# Patient Record
Sex: Female | Born: 1980 | Race: White | Hispanic: No | Marital: Married | Smoking: Current every day smoker
Health system: Southern US, Community
[De-identification: ages and names within clinical notes are randomized; demographics above are authoritative.]

## PROBLEM LIST (undated history)

## (undated) DIAGNOSIS — A159 Respiratory tuberculosis unspecified: Secondary | ICD-10-CM

## (undated) DIAGNOSIS — K2970 Gastritis, unspecified, without bleeding: Secondary | ICD-10-CM

## (undated) DIAGNOSIS — M1990 Unspecified osteoarthritis, unspecified site: Secondary | ICD-10-CM

## (undated) DIAGNOSIS — K219 Gastro-esophageal reflux disease without esophagitis: Secondary | ICD-10-CM

## (undated) DIAGNOSIS — IMO0002 Reserved for concepts with insufficient information to code with codable children: Secondary | ICD-10-CM

## (undated) DIAGNOSIS — Z9889 Other specified postprocedural states: Secondary | ICD-10-CM

## (undated) DIAGNOSIS — R87619 Unspecified abnormal cytological findings in specimens from cervix uteri: Secondary | ICD-10-CM

## (undated) DIAGNOSIS — R112 Nausea with vomiting, unspecified: Secondary | ICD-10-CM

## (undated) DIAGNOSIS — T8859XA Other complications of anesthesia, initial encounter: Secondary | ICD-10-CM

## (undated) DIAGNOSIS — T4145XA Adverse effect of unspecified anesthetic, initial encounter: Secondary | ICD-10-CM

## (undated) DIAGNOSIS — Z87442 Personal history of urinary calculi: Secondary | ICD-10-CM

## (undated) DIAGNOSIS — M329 Systemic lupus erythematosus, unspecified: Secondary | ICD-10-CM

## (undated) DIAGNOSIS — N3010 Interstitial cystitis (chronic) without hematuria: Secondary | ICD-10-CM

## (undated) DIAGNOSIS — M359 Systemic involvement of connective tissue, unspecified: Secondary | ICD-10-CM

## (undated) HISTORY — PX: ABDOMINAL HYSTERECTOMY: SHX81

## (undated) HISTORY — PX: CHOLECYSTECTOMY: SHX55

## (undated) HISTORY — PX: BLADDER SURGERY: SHX569

## (undated) HISTORY — PX: TONSILLECTOMY: SUR1361

## (undated) HISTORY — PX: TUBAL LIGATION: SHX77

---
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
## 2002-09-26 ENCOUNTER — Encounter: Admission: RE | Admit: 2002-09-26 | Discharge: 2002-09-26 | Payer: Self-pay

## 2007-12-03 ENCOUNTER — Emergency Department: Payer: Self-pay | Admitting: Internal Medicine

## 2008-07-25 ENCOUNTER — Emergency Department: Payer: Self-pay | Admitting: Emergency Medicine

## 2008-09-22 ENCOUNTER — Emergency Department: Payer: Self-pay

## 2009-03-28 ENCOUNTER — Emergency Department (HOSPITAL_COMMUNITY): Admission: EM | Admit: 2009-03-28 | Discharge: 2009-03-28 | Payer: Self-pay | Admitting: Emergency Medicine

## 2010-04-23 ENCOUNTER — Emergency Department (HOSPITAL_COMMUNITY)
Admission: EM | Admit: 2010-04-23 | Discharge: 2010-04-23 | Payer: Self-pay | Source: Home / Self Care | Admitting: Emergency Medicine

## 2010-06-14 ENCOUNTER — Emergency Department (HOSPITAL_COMMUNITY)
Admission: EM | Admit: 2010-06-14 | Discharge: 2010-06-15 | Disposition: A | Discharge status: Home / Self Care | Payer: Medicaid Other | Attending: Emergency Medicine | Admitting: Emergency Medicine

## 2010-06-14 ENCOUNTER — Emergency Department (HOSPITAL_COMMUNITY): Payer: Medicaid Other

## 2010-06-14 DIAGNOSIS — K625 Hemorrhage of anus and rectum: Secondary | ICD-10-CM | POA: Insufficient documentation

## 2010-06-14 DIAGNOSIS — F1210 Cannabis abuse, uncomplicated: Secondary | ICD-10-CM | POA: Insufficient documentation

## 2010-06-14 DIAGNOSIS — F17200 Nicotine dependence, unspecified, uncomplicated: Secondary | ICD-10-CM | POA: Insufficient documentation

## 2010-06-14 LAB — URINALYSIS, ROUTINE W REFLEX MICROSCOPIC
Nitrite: NEGATIVE
Specific Gravity, Urine: 1.01 (ref 1.005–1.030)
Urobilinogen, UA: 0.2 mg/dL (ref 0.0–1.0)
pH: 7 (ref 5.0–8.0)

## 2010-06-14 LAB — OCCULT BLOOD, POC DEVICE: Fecal Occult Bld: NEGATIVE

## 2010-06-14 LAB — DIFFERENTIAL
Basophils Absolute: 0 10*3/uL (ref 0.0–0.1)
Basophils Relative: 0 % (ref 0–1)
Eosinophils Absolute: 0.1 10*3/uL (ref 0.0–0.7)
Lymphocytes Relative: 38 % (ref 12–46)
Monocytes Absolute: 0.5 10*3/uL (ref 0.1–1.0)
Monocytes Relative: 7 % (ref 3–12)
Neutro Abs: 3.7 10*3/uL (ref 1.7–7.7)

## 2010-06-14 LAB — CBC
Platelets: 189 10*3/uL (ref 150–400)
RBC: 4.03 MIL/uL (ref 3.87–5.11)
WBC: 7 10*3/uL (ref 4.0–10.5)

## 2010-06-14 LAB — BASIC METABOLIC PANEL
Calcium: 8.8 mg/dL (ref 8.4–10.5)
GFR calc non Af Amer: >60 mL/min (ref >60)
Potassium: 3.5 mEq/L (ref 3.5–5.1)
Sodium: 138 mEq/L (ref 135–145)

## 2010-08-16 ENCOUNTER — Emergency Department (HOSPITAL_COMMUNITY)
Admission: EM | Admit: 2010-08-16 | Discharge: 2010-08-16 | Disposition: A | Discharge status: Home / Self Care | Payer: Medicaid Other | Attending: Emergency Medicine | Admitting: Emergency Medicine

## 2010-08-16 DIAGNOSIS — R350 Frequency of micturition: Secondary | ICD-10-CM | POA: Insufficient documentation

## 2010-08-16 DIAGNOSIS — N949 Unspecified condition associated with female genital organs and menstrual cycle: Secondary | ICD-10-CM | POA: Insufficient documentation

## 2010-08-16 DIAGNOSIS — IMO0002 Reserved for concepts with insufficient information to code with codable children: Secondary | ICD-10-CM | POA: Insufficient documentation

## 2010-08-16 DIAGNOSIS — N898 Other specified noninflammatory disorders of vagina: Secondary | ICD-10-CM | POA: Insufficient documentation

## 2010-08-16 LAB — URINALYSIS, ROUTINE W REFLEX MICROSCOPIC
Bilirubin Urine: NEGATIVE
Nitrite: NEGATIVE
Protein, ur: NEGATIVE mg/dL
Specific Gravity, Urine: 1.01 (ref 1.005–1.030)
Urobilinogen, UA: 1 mg/dL (ref 0.0–1.0)

## 2010-08-16 LAB — GLUCOSE, CAPILLARY

## 2010-08-16 LAB — WET PREP, GENITAL
Clue Cells Wet Prep HPF POC: NONE SEEN
WBC, Wet Prep HPF POC: NONE SEEN

## 2010-08-16 LAB — POCT PREGNANCY, URINE: Preg Test, Ur: NEGATIVE

## 2010-08-17 LAB — GC/CHLAMYDIA PROBE AMP, GENITAL: Chlamydia, DNA Probe: NEGATIVE

## 2010-09-02 ENCOUNTER — Emergency Department (HOSPITAL_COMMUNITY): Payer: Medicaid Other

## 2010-09-02 ENCOUNTER — Emergency Department (HOSPITAL_COMMUNITY)
Admission: EM | Admit: 2010-09-02 | Discharge: 2010-09-02 | Disposition: A | Discharge status: Home / Self Care | Payer: Medicaid Other | Attending: Emergency Medicine | Admitting: Emergency Medicine

## 2010-09-02 DIAGNOSIS — K921 Melena: Secondary | ICD-10-CM | POA: Insufficient documentation

## 2010-09-02 DIAGNOSIS — R1013 Epigastric pain: Secondary | ICD-10-CM | POA: Insufficient documentation

## 2010-09-02 DIAGNOSIS — M549 Dorsalgia, unspecified: Secondary | ICD-10-CM | POA: Insufficient documentation

## 2010-09-02 LAB — URINALYSIS, ROUTINE W REFLEX MICROSCOPIC
Glucose, UA: NEGATIVE mg/dL
Ketones, ur: NEGATIVE mg/dL
Nitrite: NEGATIVE
Specific Gravity, Urine: 1.012 (ref 1.005–1.030)
pH: 5.5 (ref 5.0–8.0)

## 2010-09-02 LAB — OCCULT BLOOD, POC DEVICE: Fecal Occult Bld: NEGATIVE

## 2010-09-02 LAB — CBC
Hemoglobin: 11.4 g/dL — ABNORMAL LOW (ref 12.0–15.0)
MCH: 25 pg — ABNORMAL LOW (ref 26.0–34.0)
Platelets: 246 10*3/uL (ref 150–400)
RBC: 4.56 MIL/uL (ref 3.87–5.11)
WBC: 7.4 10*3/uL (ref 4.0–10.5)

## 2010-09-02 LAB — BASIC METABOLIC PANEL
BUN: 10 mg/dL (ref 6–23)
Chloride: 102 mEq/L (ref 96–112)
GFR calc Af Amer: >60 mL/min (ref >60)
GFR calc non Af Amer: >60 mL/min (ref >60)
Potassium: 3.7 mEq/L (ref 3.5–5.1)
Sodium: 138 mEq/L (ref 135–145)

## 2010-09-02 LAB — PREGNANCY, URINE: Preg Test, Ur: NEGATIVE

## 2010-09-02 LAB — DIFFERENTIAL
Basophils Absolute: 0 10*3/uL (ref 0.0–0.1)
Basophils Relative: 0 % (ref 0–1)
Eosinophils Absolute: 0.1 10*3/uL (ref 0.0–0.7)
Monocytes Relative: 5 % (ref 3–12)
Neutro Abs: 4.4 10*3/uL (ref 1.7–7.7)
Neutrophils Relative %: 60 % (ref 43–77)

## 2011-01-18 ENCOUNTER — Emergency Department (HOSPITAL_COMMUNITY)
Admission: EM | Admit: 2011-01-18 | Discharge: 2011-01-18 | Disposition: A | Discharge status: Home / Self Care | Payer: Medicaid Other | Attending: Emergency Medicine | Admitting: Emergency Medicine

## 2011-01-18 DIAGNOSIS — H5710 Ocular pain, unspecified eye: Secondary | ICD-10-CM | POA: Insufficient documentation

## 2011-01-18 DIAGNOSIS — H05019 Cellulitis of unspecified orbit: Secondary | ICD-10-CM | POA: Insufficient documentation

## 2011-02-20 ENCOUNTER — Ambulatory Visit
Admission: RE | Admit: 2011-02-20 | Discharge: 2011-02-20 | Disposition: A | Discharge status: Home / Self Care | Payer: Medicaid Other | Source: Ambulatory Visit | Attending: Family Medicine | Admitting: Family Medicine

## 2011-02-20 ENCOUNTER — Other Ambulatory Visit: Payer: Self-pay | Admitting: Family Medicine

## 2011-02-20 DIAGNOSIS — M25519 Pain in unspecified shoulder: Secondary | ICD-10-CM

## 2011-02-20 DIAGNOSIS — M25539 Pain in unspecified wrist: Secondary | ICD-10-CM

## 2012-02-01 ENCOUNTER — Encounter (HOSPITAL_COMMUNITY): Payer: Self-pay | Admitting: Nurse Practitioner

## 2012-02-01 ENCOUNTER — Emergency Department (HOSPITAL_COMMUNITY): Payer: Medicaid Other

## 2012-02-01 ENCOUNTER — Emergency Department (HOSPITAL_COMMUNITY)
Admission: EM | Admit: 2012-02-01 | Discharge: 2012-02-01 | Disposition: A | Discharge status: Home / Self Care | Payer: Medicaid Other | Attending: Emergency Medicine | Admitting: Emergency Medicine

## 2012-02-01 DIAGNOSIS — N3010 Interstitial cystitis (chronic) without hematuria: Secondary | ICD-10-CM | POA: Insufficient documentation

## 2012-02-01 DIAGNOSIS — N949 Unspecified condition associated with female genital organs and menstrual cycle: Secondary | ICD-10-CM | POA: Insufficient documentation

## 2012-02-01 DIAGNOSIS — R300 Dysuria: Secondary | ICD-10-CM | POA: Insufficient documentation

## 2012-02-01 DIAGNOSIS — N739 Female pelvic inflammatory disease, unspecified: Secondary | ICD-10-CM | POA: Insufficient documentation

## 2012-02-01 DIAGNOSIS — F17200 Nicotine dependence, unspecified, uncomplicated: Secondary | ICD-10-CM | POA: Insufficient documentation

## 2012-02-01 HISTORY — DX: Interstitial cystitis (chronic) without hematuria: N30.10

## 2012-02-01 HISTORY — DX: Gastritis, unspecified, without bleeding: K29.70

## 2012-02-01 LAB — CBC WITH DIFFERENTIAL/PLATELET
Basophils Relative: 0 % (ref 0–1)
Eosinophils Absolute: 0.1 10*3/uL (ref 0.0–0.7)
Hemoglobin: 15 g/dL (ref 12.0–15.0)
Lymphs Abs: 2.7 10*3/uL (ref 0.7–4.0)
MCH: 30.6 pg (ref 26.0–34.0)
Neutro Abs: 9 10*3/uL — ABNORMAL HIGH (ref 1.7–7.7)
Neutrophils Relative %: 73 % (ref 43–77)
Platelets: 222 10*3/uL (ref 150–400)
RBC: 4.9 MIL/uL (ref 3.87–5.11)

## 2012-02-01 LAB — POCT PREGNANCY, URINE: Preg Test, Ur: NEGATIVE

## 2012-02-01 LAB — URINALYSIS, MICROSCOPIC ONLY
Bilirubin Urine: NEGATIVE
Glucose, UA: NEGATIVE mg/dL
Hgb urine dipstick: NEGATIVE
Ketones, ur: NEGATIVE mg/dL
pH: 5.5 (ref 5.0–8.0)

## 2012-02-01 LAB — COMPREHENSIVE METABOLIC PANEL
ALT: 13 U/L (ref 0–35)
Albumin: 3.9 g/dL (ref 3.5–5.2)
Alkaline Phosphatase: 78 U/L (ref 39–117)
Chloride: 101 mEq/L (ref 96–112)
Potassium: 3.7 mEq/L (ref 3.5–5.1)
Sodium: 138 mEq/L (ref 135–145)
Total Bilirubin: 0.3 mg/dL (ref 0.3–1.2)
Total Protein: 7.8 g/dL (ref 6.0–8.3)

## 2012-02-01 MED ORDER — OXYCODONE-ACETAMINOPHEN 5-325 MG PO TABS: 1.0000 | ORAL_TABLET | Freq: Four times a day (QID) | ORAL | Status: DC | PRN

## 2012-02-01 MED ORDER — METRONIDAZOLE 500 MG PO TABS: 500.0000 mg | ORAL_TABLET | Freq: Two times a day (BID) | ORAL | Status: DC

## 2012-02-01 MED ORDER — AZITHROMYCIN 500 MG PO TABS: 1000.0000 mg | ORAL_TABLET | Freq: Once | ORAL | Status: DC

## 2012-02-01 MED ORDER — OXYCODONE-ACETAMINOPHEN 5-325 MG PO TABS
1.0000 | ORAL_TABLET | Freq: Once | ORAL | Status: AC
  Administered 2012-02-01: 1 via ORAL
  Filled 2012-02-01: qty 1

## 2012-02-01 NOTE — ED Provider Notes (Signed)
History     CSN: [id number]  Arrival date & time 02/01/12  1529   First MD Initiated Contact with Patient 02/01/12 1837      Chief Complaint  Patient presents with  . Pelvic Pain    (Consider location/radiation/quality/duration/timing/severity/associated sxs/prior treatment) Patient is a 31 y.o. female presenting with pelvic pain. The history is provided by the patient.  Pelvic Pain This is a new problem. Pertinent negatives include no chest pain, no abdominal pain, no headaches and no shortness of breath.   patient saw her primary care doctor on Tuesday for abdominal pain and was diagnosed with PID and Trichomonas. She was recently seen at [facility] for similar symptoms. She was given IM antibiotics since her on oral Avelox. She states she was given 6 pills and threw them up. She's continued to have abdominal pain. She states it goes up her back. She has some dysuria but has a history of interstitial cystitis. She's had a previous tubal ligation. She states that she's been told before that she had an abscess in her pelvis. She states it went way on its own. Denies vaginal discharge. No fevers. No chills.  Past Medical History  Diagnosis Date  . Interstitial cystitis   . Gastritis     History reviewed. No pertinent past surgical history.  History reviewed. No pertinent family history.  History  Substance Use Topics  . Smoking status: Current Every Day Smoker  . Smokeless tobacco: Not on file  . Alcohol Use: No    OB History    Grav Para Term Preterm Abortions TAB SAB Ect Mult Living                  Review of Systems  Constitutional: Negative for activity change and appetite change.  HENT: Negative for neck stiffness.   Eyes: Negative for pain.  Respiratory: Negative for chest tightness and shortness of breath.   Cardiovascular: Negative for chest pain and leg swelling.  Gastrointestinal: Negative for nausea, vomiting, abdominal pain and diarrhea.  Genitourinary:  Positive for flank pain and pelvic pain. Negative for hematuria, vaginal bleeding, vaginal discharge and vaginal pain.  Musculoskeletal: Negative for back pain.  Skin: Negative for rash.  Neurological: Negative for weakness, numbness and headaches.  Psychiatric/Behavioral: Negative for behavioral problems.    Allergies  Nsaids  Home Medications   Current Outpatient Rx  Name Route Sig Dispense Refill  . AZITHROMYCIN 500 MG PO TABS Oral Take 1,000 mg by mouth once. Last taken Tuesday 01/30/12    . PHENTERMINE HCL 37.5 MG PO CAPS Oral Take 37.5 mg by mouth every morning.    Marland Kitchen PROMETHAZINE HCL 25 MG PO TABS Oral Take 25 mg by mouth every 4 (four) hours as needed. For nausea    . AZITHROMYCIN 500 MG PO TABS Oral Take 2 tablets (1,000 mg total) by mouth once. 2 tablet 0  . METRONIDAZOLE 500 MG PO TABS Oral Take 1 tablet (500 mg total) by mouth 2 (two) times daily. 14 tablet 0  . OXYCODONE-ACETAMINOPHEN 5-325 MG PO TABS Oral Take 1-2 tablets by mouth every 6 (six) hours as needed for pain. 8 tablet 0    BP 122/86  Pulse 92  Temp 98.1 F (36.7 C) (Oral)  Resp 18  SpO2 100%  Physical Exam  Nursing note and vitals reviewed. Constitutional: She is oriented to person, place, and time. She appears well-developed and well-nourished.  HENT:  Head: Normocephalic and atraumatic.  Eyes: EOM are normal. Pupils are equal,  round, and reactive to light.  Neck: Normal range of motion. Neck supple.  Cardiovascular: Normal rate, regular rhythm and normal heart sounds.   No murmur heard. Pulmonary/Chest: Effort normal and breath sounds normal. No respiratory distress. She has no wheezes. She has no rales.  Abdominal: Soft. Bowel sounds are normal. She exhibits no distension. There is tenderness. There is no rebound and no guarding.       Suprapubic to lower abdominal tenderness. Worse on left side. No rebound or guarding.  Musculoskeletal: Normal range of motion.  Neurological: She is alert and  oriented to person, place, and time. No cranial nerve deficit.  Skin: Skin is warm and dry.  Psychiatric: She has a normal mood and affect. Her speech is normal.    ED Course  Procedures (including critical care time)  Labs Reviewed  CBC WITH DIFFERENTIAL - Abnormal; Notable for the following:    WBC 12.2 (*)     Neutro Abs 9.0 (*)     All other components within normal limits  COMPREHENSIVE METABOLIC PANEL - Abnormal; Notable for the following:    Glucose, Bld 108 (*)     All other components within normal limits  URINALYSIS, MICROSCOPIC ONLY - Abnormal; Notable for the following:    Squamous Epithelial / LPF FEW (*)     All other components within normal limits  POCT PREGNANCY, URINE   US Transvaginal Non-ob  02/01/2012  *RADIOLOGY REPORT*  Clinical Data:  Pelvic pain.  Rule out tubo-ovarian abscess.  TRANSABDOMINAL AND TRANSVAGINAL ULTRASOUND OF PELVIS DOPPLER ULTRASOUND OF OVARIES  Technique:  Both transabdominal and transvaginal ultrasound examinations of the pelvis were performed. Transabdominal technique was performed for global imaging of the pelvis including uterus, ovaries, adnexal regions, and pelvic cul-de-sac.  It was necessary to proceed with endovaginal exam following the transabdominal exam to visualize the ovary / adnexa.  Color and duplex Doppler ultrasound was utilized to evaluate blood flow to the ovaries.  Comparison:  11/20/2011 from [facility].  Findings:  Uterus:  8.1 x 5.7 x 5.3 cm.  Mildly heterogeneous uterine echotexture again identified.  Endometrium:  Normal for age, 14 mm.  Right ovary: 3.6 x 2.8 x 2.1 cm. Normal in morphology.  Left ovary:   3.1 x 2.6 x 2.3 cm.  A minimally hypoechoic lesion has peripheral hypervascularity and measures 1.7 cm on image 73.  Pulsed Doppler evaluation demonstrates normal low-resistance arterial and venous waveforms in both ovaries.  No significant free fluid.  IMPRESSION:  1.  No evidence of ovarian or adnexal torsion.  No  evidence of tubal ovarian abscess. 2.  1.7 cm left ovarian lesion demonstrates peripheral hypervascularity and is most likely a corpus luteal cyst. 3.  Heterogeneous uterine echotexture again may represent adenomyosis.   Original Report Authenticated By: Consuello Bossier, M.D.    US Pelvis Complete  02/01/2012  *RADIOLOGY REPORT*  Clinical Data:  Pelvic pain.  Rule out tubo-ovarian abscess.  TRANSABDOMINAL AND TRANSVAGINAL ULTRASOUND OF PELVIS DOPPLER ULTRASOUND OF OVARIES  Technique:  Both transabdominal and transvaginal ultrasound examinations of the pelvis were performed. Transabdominal technique was performed for global imaging of the pelvis including uterus, ovaries, adnexal regions, and pelvic cul-de-sac.  It was necessary to proceed with endovaginal exam following the transabdominal exam to visualize the ovary / adnexa.  Color and duplex Doppler ultrasound was utilized to evaluate blood flow to the ovaries.  Comparison:  11/20/2011 from [facility].  Findings:  Uterus:  8.1 x 5.7 x 5.3 cm.  Mildly heterogeneous uterine echotexture again identified.  Endometrium:  Normal for age, 14 mm.  Right ovary: 3.6 x 2.8 x 2.1 cm. Normal in morphology.  Left ovary:   3.1 x 2.6 x 2.3 cm.  A minimally hypoechoic lesion has peripheral hypervascularity and measures 1.7 cm on image 73.  Pulsed Doppler evaluation demonstrates normal low-resistance arterial and venous waveforms in both ovaries.  No significant free fluid.  IMPRESSION:  1.  No evidence of ovarian or adnexal torsion.  No evidence of tubal ovarian abscess. 2.  1.7 cm left ovarian lesion demonstrates peripheral hypervascularity and is most likely a corpus luteal cyst. 3.  Heterogeneous uterine echotexture again may represent adenomyosis.   Original Report Authenticated By: Consuello Bossier, M.D.    [place] Art/ven Flow Abd Pelv Doppler  02/01/2012  *RADIOLOGY REPORT*  Clinical Data:  Pelvic pain.  Rule out tubo-ovarian abscess.  TRANSABDOMINAL AND TRANSVAGINAL  ULTRASOUND OF PELVIS DOPPLER ULTRASOUND OF OVARIES  Technique:  Both transabdominal and transvaginal ultrasound examinations of the pelvis were performed. Transabdominal technique was performed for global imaging of the pelvis including uterus, ovaries, adnexal regions, and pelvic cul-de-sac.  It was necessary to proceed with endovaginal exam following the transabdominal exam to visualize the ovary / adnexa.  Color and duplex Doppler ultrasound was utilized to evaluate blood flow to the ovaries.  Comparison:  11/20/2011 from [facility].  Findings:  Uterus:  8.1 x 5.7 x 5.3 cm.  Mildly heterogeneous uterine echotexture again identified.  Endometrium:  Normal for age, 14 mm.  Right ovary: 3.6 x 2.8 x 2.1 cm. Normal in morphology.  Left ovary:   3.1 x 2.6 x 2.3 cm.  A minimally hypoechoic lesion has peripheral hypervascularity and measures 1.7 cm on image 73.  Pulsed Doppler evaluation demonstrates normal low-resistance arterial and venous waveforms in both ovaries.  No significant free fluid.  IMPRESSION:  1.  No evidence of ovarian or adnexal torsion.  No evidence of tubal ovarian abscess. 2.  1.7 cm left ovarian lesion demonstrates peripheral hypervascularity and is most likely a corpus luteal cyst. 3.  Heterogeneous uterine echotexture again may represent adenomyosis.   Original Report Authenticated By: Consuello Bossier, M.D.      1. PID (pelvic inflammatory disease)       MDM  Patient with a diagnosis of pelvic inflammatory disease by her primary care Dr. Pelvic done 2 days ago. She was given IM antibiotics and was also given oral Avelox. She states she vomited oral antibiotics. She's had continued abdominal and lower back pain. No dysuria. She has mild tenderness. Ultrasound was done and did not show an abscess. She will be discharged home with Flagyl and azithromycin. She'll followup with her doctor or with the [facility] clinic.        Juliet Rude. Rubin Payor, MD 02/01/12 2204

## 2012-02-01 NOTE — ED Notes (Signed)
Pt was treated by PCP for PID and trichomonas on Tuesday with IM abx and started on oral abx. States still having severe abd and lower back pain

## 2012-02-08 ENCOUNTER — Inpatient Hospital Stay (HOSPITAL_COMMUNITY)
Admission: AD | Admit: 2012-02-08 | Discharge: 2012-02-08 | Disposition: A | Discharge status: Home / Self Care | Payer: Medicaid Other | Source: Ambulatory Visit | Attending: Obstetrics and Gynecology | Admitting: Obstetrics and Gynecology

## 2012-02-08 ENCOUNTER — Encounter (HOSPITAL_COMMUNITY): Payer: Self-pay | Admitting: *Deleted

## 2012-02-08 DIAGNOSIS — R109 Unspecified abdominal pain: Secondary | ICD-10-CM

## 2012-02-08 DIAGNOSIS — R102 Pelvic and perineal pain: Secondary | ICD-10-CM

## 2012-02-08 HISTORY — DX: Reserved for concepts with insufficient information to code with codable children: IMO0002

## 2012-02-08 HISTORY — DX: Unspecified abnormal cytological findings in specimens from cervix uteri: R87.619

## 2012-02-08 HISTORY — DX: Respiratory tuberculosis unspecified: A15.9

## 2012-02-08 LAB — WET PREP, GENITAL

## 2012-02-08 LAB — URINE MICROSCOPIC-ADD ON

## 2012-02-08 LAB — URINALYSIS, ROUTINE W REFLEX MICROSCOPIC
Bilirubin Urine: NEGATIVE
Nitrite: NEGATIVE
Specific Gravity, Urine: >1.03 — ABNORMAL HIGH (ref 1.005–1.030)
pH: 5.5 (ref 5.0–8.0)

## 2012-02-08 MED ORDER — OXYCODONE-ACETAMINOPHEN 5-325 MG PO TABS
1.0000 | ORAL_TABLET | Freq: Once | ORAL | Status: AC
  Administered 2012-02-08: 1 via ORAL
  Filled 2012-02-08: qty 1

## 2012-02-08 MED ORDER — OXYCODONE-ACETAMINOPHEN 5-325 MG PO TABS: 1.0000 | ORAL_TABLET | Freq: Four times a day (QID) | ORAL | Status: DC | PRN

## 2012-02-08 NOTE — MAU Provider Note (Signed)
History     CSN: [id number]  Arrival date and time: 02/08/12 1108   None     Chief Complaint  Patient presents with  . Abdominal Pain  . Back Pain   HPI Teresa Escobar is 31 y.o. presents with ? PID. G5 P4014.  LMP 01/12/12.  Normal for her.  Hx of endometrial ablation in past for heavy, painful periods.  Feels dysmenorrhea is returning.   She was seen by Dr. Clarene Duke 2 weeks ago for pain. Was treated with Flagyl for trichomonas, and a Rx for Zithromax as well as 2 injections in her hips on that visit.  She was then seen on October 18 at [facility] with abdominal pain. Ultrasound on that date ruled out torsion, 1.7cm CLC and heterogeneous uterine echotexture suggesting adenomyosis. Wet prep and pregnancy test that date were negative.    treated 2 weeks ago.  Was seen by Dr. Clarene Duke in [place] 2 days ago  for recheck.  Has Pap that was abnormal and repeat this week.  He also treated her for ? PID with 2 injections in her hips.  Came here today for worsening pain at night.  Has percocet that she takes at night but now it is not helping.  She is allergic to NSAIDS.  Now having lower abdominal pain. Hx of interstitial cysitis.   Past Medical History  Diagnosis Date  . Interstitial cystitis   . Gastritis     No past surgical history on file.  No family history on file.  History  Substance Use Topics  . Smoking status: Current Every Day Smoker  . Smokeless tobacco: Not on file  . Alcohol Use: No    Allergies:  Allergies  Allergen Reactions  . Nsaids     GI     Prescriptions prior to admission  Medication Sig Dispense Refill  . oxyCODONE-acetaminophen (PERCOCET/ROXICET) 5-325 MG per tablet Take 1-2 tablets by mouth every 6 (six) hours as needed for pain.  8 tablet  0  . phentermine 37.5 MG capsule Take 37.5 mg by mouth every morning.      . promethazine (PHENERGAN) 25 MG tablet Take 25 mg by mouth every 4 (four) hours as needed. For nausea        Review of Systems  Constitutional:  Negative.   HENT: Negative.   Respiratory: Negative.   Cardiovascular: Negative.   Gastrointestinal: Positive for abdominal pain.  Musculoskeletal: Positive for back pain.   Physical Exam   Blood pressure 133/94, pulse 100, temperature 97.6 F (36.4 C), temperature source Oral, resp. rate 20, height 5' 4.5" (1.638 m), weight 103.42 kg (228 lb), last menstrual period 01/12/2012.  Physical Exam  Constitutional: She appears well-developed and well-nourished. No distress.  HENT:  Head: Normocephalic.  Neck: Normal range of motion.  Cardiovascular: Normal rate.   Respiratory: Effort normal.  GI: Soft. She exhibits no distension and no mass. There is tenderness (mild diffuse pain.  No localization of pain). There is no rebound and no guarding.  Genitourinary: There is no tenderness or lesion on the right labia. There is no tenderness or lesion on the left labia. Uterus is tender. Uterus is not enlarged. Cervix exhibits no discharge and no friability. Right adnexum displays tenderness (mild). Right adnexum displays no mass and no fullness. Left adnexum displays tenderness (mild). Left adnexum displays no mass and no fullness. No tenderness or bleeding around the vagina. No vaginal discharge found.    Results for orders placed during the hospital encounter of 02/08/12 (  from the past 24 hour(s))  URINALYSIS, ROUTINE W REFLEX MICROSCOPIC     Status: Abnormal   Collection Time   02/08/12 11:25 AM      Component Value Range   Color, Urine YELLOW  YELLOW   APPearance CLEAR  CLEAR   Specific Gravity, Urine >1.030 (*) 1.005 - 1.030   pH 5.5  5.0 - 8.0   Glucose, UA NEGATIVE  NEGATIVE mg/dL   Hgb urine dipstick TRACE (*) NEGATIVE   Bilirubin Urine NEGATIVE  NEGATIVE   Ketones, ur >80 (*) NEGATIVE mg/dL   Protein, ur NEGATIVE  NEGATIVE mg/dL   Urobilinogen, UA 0.2  0.0 - 1.0 mg/dL   Nitrite NEGATIVE  NEGATIVE   Leukocytes, UA NEGATIVE  NEGATIVE  URINE MICROSCOPIC-ADD ON     Status: Abnormal    Collection Time   02/08/12 11:25 AM      Component Value Range   Squamous Epithelial / LPF MANY (*) RARE   WBC, UA 0-2  <3 WBC/hpf   RBC / HPF 0-2  <3 RBC/hpf   Bacteria, UA FEW (*) RARE   Urine-Other MUCOUS PRESENT    WET PREP, GENITAL     Status: Abnormal   Collection Time   02/08/12 11:50 AM      Component Value Range   Yeast Wet Prep HPF POC NONE SEEN  NONE SEEN   Trich, Wet Prep NONE SEEN  NONE SEEN   Clue Cells Wet Prep HPF POC NONE SEEN  NONE SEEN   WBC, Wet Prep HPF POC FEW (*) NONE SEEN    MAU Course  Procedures  MDM Discussed physical exam findings, lab results and plan of care with the patient.  She reports she has 2 Percocet tabs left from Rx given at [facility] visit.  I will give her one here, she has a ride home. She reports allergy to NSAIDS.   Rx for Percocet 10mg  given.  Offered GYN CLINIC appointment but she has spoken to a GYN practice in town that will take Medicaid and they can see her on Monday 28.  She will plan to keep that appointment.    Assessment and Plan  A:  Abdominal pain     Recent ultrasound suggests possible Adenomyosis    Recent treatment with Dr. Clarene Duke for trichomonas and coverage for PID  P:  Encourage her to keep appointment she has with GYN practice for Monday     Rx for Percocet tabs #10     Return for worsening sxs.  Braulio Kiedrowski,EVE M 02/08/2012, 11:32 AM

## 2012-02-08 NOTE — MAU Provider Note (Signed)
Attestation of Attending Supervision of Advanced Practitioner (CNM/NP): Evaluation and management procedures were performed by the Advanced Practitioner under my supervision and collaboration.  I have reviewed the Advanced Practitioner's note and chart, and I agree with the management and plan.  Elizandro Laura 02/08/2012 4:41 PM

## 2012-02-08 NOTE — MAU Note (Signed)
Saw Dr a wk ago Tues, was having abd and back pain.  Dx with trichomonas. Pt and partner both treated.   Still having symptoms.  Pain with urination.

## 2012-04-15 ENCOUNTER — Emergency Department: Payer: Self-pay | Admitting: Emergency Medicine

## 2012-04-16 ENCOUNTER — Emergency Department: Payer: Self-pay | Admitting: Emergency Medicine

## 2012-04-19 LAB — WOUND CULTURE

## 2012-12-26 ENCOUNTER — Encounter (HOSPITAL_COMMUNITY): Payer: Self-pay | Admitting: Emergency Medicine

## 2012-12-26 ENCOUNTER — Emergency Department (HOSPITAL_COMMUNITY)
Admission: EM | Admit: 2012-12-26 | Discharge: 2012-12-26 | Discharge status: Against Medical Advice | Payer: Medicaid Other | Attending: Emergency Medicine | Admitting: Emergency Medicine

## 2012-12-26 DIAGNOSIS — Z79899 Other long term (current) drug therapy: Secondary | ICD-10-CM | POA: Insufficient documentation

## 2012-12-26 DIAGNOSIS — R51 Headache: Secondary | ICD-10-CM | POA: Insufficient documentation

## 2012-12-26 DIAGNOSIS — R300 Dysuria: Secondary | ICD-10-CM | POA: Insufficient documentation

## 2012-12-26 DIAGNOSIS — Z87448 Personal history of other diseases of urinary system: Secondary | ICD-10-CM | POA: Insufficient documentation

## 2012-12-26 DIAGNOSIS — R110 Nausea: Secondary | ICD-10-CM | POA: Insufficient documentation

## 2012-12-26 DIAGNOSIS — R109 Unspecified abdominal pain: Secondary | ICD-10-CM | POA: Insufficient documentation

## 2012-12-26 DIAGNOSIS — Z8611 Personal history of tuberculosis: Secondary | ICD-10-CM | POA: Insufficient documentation

## 2012-12-26 DIAGNOSIS — R197 Diarrhea, unspecified: Secondary | ICD-10-CM | POA: Insufficient documentation

## 2012-12-26 DIAGNOSIS — F17200 Nicotine dependence, unspecified, uncomplicated: Secondary | ICD-10-CM | POA: Insufficient documentation

## 2012-12-26 DIAGNOSIS — Z3202 Encounter for pregnancy test, result negative: Secondary | ICD-10-CM | POA: Insufficient documentation

## 2012-12-26 LAB — CBC WITH DIFFERENTIAL/PLATELET
Basophils Absolute: 0 10*3/uL (ref 0.0–0.1)
Basophils Relative: 0 % (ref 0–1)
Eosinophils Absolute: 0.1 10*3/uL (ref 0.0–0.7)
Eosinophils Relative: 2 % (ref 0–5)
Lymphocytes Relative: 35 % (ref 12–46)
MCHC: 35.9 g/dL (ref 30.0–36.0)
MCV: 87.2 fL (ref 78.0–100.0)
Platelets: 213 10*3/uL (ref 150–400)
RDW: 11.9 % (ref 11.5–15.5)
WBC: 7.4 10*3/uL (ref 4.0–10.5)

## 2012-12-26 LAB — COMPREHENSIVE METABOLIC PANEL
ALT: 16 U/L (ref 0–35)
AST: 14 U/L (ref 0–37)
Albumin: 3.8 g/dL (ref 3.5–5.2)
CO2: 28 mEq/L (ref 19–32)
Calcium: 9.5 mg/dL (ref 8.4–10.5)
Creatinine, Ser: 0.72 mg/dL (ref 0.50–1.10)
Sodium: 137 mEq/L (ref 135–145)
Total Protein: 7.3 g/dL (ref 6.0–8.3)

## 2012-12-26 LAB — URINALYSIS, ROUTINE W REFLEX MICROSCOPIC
Bilirubin Urine: NEGATIVE
Hgb urine dipstick: NEGATIVE
Specific Gravity, Urine: 1.028 (ref 1.005–1.030)
pH: 5 (ref 5.0–8.0)

## 2012-12-26 LAB — POCT PREGNANCY, URINE: Preg Test, Ur: NEGATIVE

## 2012-12-26 MED ORDER — METOCLOPRAMIDE HCL 5 MG/ML IJ SOLN
10.0000 mg | Freq: Once | INTRAMUSCULAR | Status: AC
  Administered 2012-12-26: 10 mg via INTRAVENOUS
  Filled 2012-12-26: qty 2

## 2012-12-26 MED ORDER — DIPHENHYDRAMINE HCL 50 MG/ML IJ SOLN
25.0000 mg | Freq: Once | INTRAMUSCULAR | Status: AC
  Administered 2012-12-26: 25 mg via INTRAVENOUS
  Filled 2012-12-26: qty 1

## 2012-12-26 MED ORDER — KETOROLAC TROMETHAMINE 15 MG/ML IJ SOLN
15.0000 mg | Freq: Once | INTRAMUSCULAR | Status: DC
  Filled 2012-12-26: qty 1

## 2012-12-26 NOTE — ED Provider Notes (Signed)
CSN: [id number]     Arrival date & time 12/26/12  1649 History   First MD Initiated Contact with Patient 12/26/12 1802     Chief Complaint  Patient presents with  . Abdominal Pain  . Diarrhea   (Consider location/radiation/quality/duration/timing/severity/associated sxs/prior Treatment) HPI Comments: Patient with history of interstitial cystitis, gastritis, recent hemorrhoid surgery -- presents with complaint of right-sided abdominal pain that began yesterday. Pain is gradually moves around to the front part of her abdomen. It is associated with the headache, nausea but no vomiting. She has urinary burning that is at base line. Patient takes Phenergan for nausea which has helped. Patient takes Suboxone for previous narcotics use. Per the [facility] substance reporting database, patient has received #120 oxycodone 5 mg tablets from various providers since 12/13/12. Onset of symptoms gradual. Course is constant. Nothing makes symptoms better or worse.  The history is provided by the patient.    Past Medical History  Diagnosis Date  . Interstitial cystitis   . Gastritis   . Abnormal Pap smear   . Tuberculosis    History reviewed. No pertinent past surgical history. History reviewed. No pertinent family history. History  Substance Use Topics  . Smoking status: Current Every Day Smoker  . Smokeless tobacco: Not on file  . Alcohol Use: No   OB History   Grav Para Term Preterm Abortions TAB SAB Ect Mult Living   5 4 4  1  1   4      Review of Systems  Constitutional: Negative for fever.  HENT: Negative for sore throat and rhinorrhea.   Eyes: Negative for redness.  Respiratory: Negative for cough.   Cardiovascular: Negative for chest pain.  Gastrointestinal: Positive for nausea and abdominal pain. Negative for vomiting, diarrhea and blood in stool.  Genitourinary: Positive for dysuria (baseline).  Musculoskeletal: Negative for myalgias.  Skin: Negative for rash.  Neurological:  Positive for headaches (Generalized).    Allergies  Nsaids  Home Medications   Current Outpatient Rx  Name  Route  Sig  Dispense  Refill  . oxyCODONE-acetaminophen (PERCOCET/ROXICET) 5-325 MG per tablet   Oral   Take 1-2 tablets by mouth every 6 (six) hours as needed for pain.   8 tablet   0   . oxyCODONE-acetaminophen (ROXICET) 5-325 MG per tablet   Oral   Take 1 tablet by mouth every 6 (six) hours as needed for pain.   10 tablet   0   . phentermine 37.5 MG capsule   Oral   Take 37.5 mg by mouth every morning.         . promethazine (PHENERGAN) 25 MG tablet   Oral   Take 25 mg by mouth every 4 (four) hours as needed. For nausea          BP 147/98  Pulse 95  Temp(Src) 98.1 F (36.7 C) (Oral)  Resp 18  SpO2 99% Physical Exam  Nursing note and vitals reviewed. Constitutional: She appears well-developed and well-nourished.  HENT:  Head: Normocephalic and atraumatic.  Eyes: Conjunctivae are normal. Right eye exhibits no discharge. Left eye exhibits no discharge.  Neck: Normal range of motion. Neck supple.  Cardiovascular: Normal rate, regular rhythm and normal heart sounds.   Pulmonary/Chest: Effort normal and breath sounds normal.  Abdominal: Soft. There is tenderness (Mild). There is no rigidity, no rebound, no guarding, no CVA tenderness, no tenderness at McBurney's point and negative Murphy's sign.  Musculoskeletal: She exhibits no edema and no tenderness.  Neurological:  She is alert.  Skin: Skin is warm and dry.  Psychiatric: She has a normal mood and affect.    ED Course  Procedures (including critical care time) Labs Review Labs Reviewed  COMPREHENSIVE METABOLIC PANEL - Abnormal; Notable for the following:    Total Bilirubin 0.2 (*)    All other components within normal limits  CBC WITH DIFFERENTIAL  URINALYSIS, ROUTINE W REFLEX MICROSCOPIC  POCT PREGNANCY, URINE   Imaging Review No results found.  7:02 PM Patient seen and examined. Work-up  initiated. Pt informed of all lab results. Medications ordered.   Vital signs reviewed and are as follows: Filed Vitals:   12/26/12 1704  BP: 147/98  Pulse: 95  Temp: 98.1 F (36.7 C)  Resp: 18   7:36 PM Pt left AMA stating that she got a call from the PICU where her child is, and had to leave immediately.    MDM   1. Abdominal pain    Pt left AMA. No concern for serious abdominal process given labs or exam/history.     Renne Crigler, PA-C 12/26/12 [phone]

## 2012-12-26 NOTE — ED Notes (Signed)
Pt. Had to leave to go see her son in the PICU.  Pt. Said she would return when everything got better with her son.

## 2012-12-26 NOTE — ED Notes (Signed)
Pt sts right sided abd pain with bloody mucous diarrhea today; pt sts started having pan yesterday; pt sts recent hemorrhoid sx

## 2012-12-26 NOTE — ED Notes (Signed)
Patient C/O right abdominal pain that began 2 days ago.  Pain began in her right side and progressed to her right abdomen. States that she has had some bloody mucoid stools.  States that she had hemorrhoid surgery 2 weeks ago.

## 2012-12-28 NOTE — ED Provider Notes (Signed)
Medical screening examination/treatment/procedure(s) were performed by non-physician practitioner and as supervising physician I was immediately available for consultation/collaboration.    Celene Kras, MD 12/28/12 [phone]

## 2013-03-10 ENCOUNTER — Encounter (HOSPITAL_COMMUNITY): Payer: Self-pay | Admitting: *Deleted

## 2013-03-12 ENCOUNTER — Encounter (HOSPITAL_COMMUNITY): Payer: Self-pay | Admitting: Pharmacy Technician

## 2013-03-18 ENCOUNTER — Other Ambulatory Visit: Payer: Self-pay | Admitting: Gastroenterology

## 2013-03-19 ENCOUNTER — Encounter (HOSPITAL_COMMUNITY): Payer: Medicaid Other | Admitting: Anesthesiology

## 2013-03-19 ENCOUNTER — Encounter (HOSPITAL_COMMUNITY)
Admission: RE | Disposition: A | Discharge status: Home / Self Care | Payer: Self-pay | Source: Ambulatory Visit | Attending: Gastroenterology

## 2013-03-19 ENCOUNTER — Ambulatory Visit (HOSPITAL_COMMUNITY): Payer: Medicaid Other | Admitting: Anesthesiology

## 2013-03-19 ENCOUNTER — Encounter (HOSPITAL_COMMUNITY): Payer: Self-pay | Admitting: *Deleted

## 2013-03-19 ENCOUNTER — Ambulatory Visit (HOSPITAL_COMMUNITY)
Admission: RE | Admit: 2013-03-19 | Discharge: 2013-03-19 | Disposition: A | Discharge status: Home / Self Care | Payer: Medicaid Other | Source: Ambulatory Visit | Attending: Gastroenterology | Admitting: Gastroenterology

## 2013-03-19 DIAGNOSIS — K921 Melena: Secondary | ICD-10-CM | POA: Insufficient documentation

## 2013-03-19 DIAGNOSIS — R933 Abnormal findings on diagnostic imaging of other parts of digestive tract: Secondary | ICD-10-CM | POA: Insufficient documentation

## 2013-03-19 HISTORY — DX: Other specified postprocedural states: Z98.890

## 2013-03-19 HISTORY — DX: Other complications of anesthesia, initial encounter: T88.59XA

## 2013-03-19 HISTORY — DX: Nausea with vomiting, unspecified: R11.2

## 2013-03-19 HISTORY — DX: Gastro-esophageal reflux disease without esophagitis: K21.9

## 2013-03-19 HISTORY — PX: COLONOSCOPY WITH PROPOFOL: SHX5780

## 2013-03-19 HISTORY — DX: Unspecified osteoarthritis, unspecified site: M19.90

## 2013-03-19 HISTORY — DX: Personal history of urinary calculi: Z87.442

## 2013-03-19 HISTORY — DX: Adverse effect of unspecified anesthetic, initial encounter: T41.45XA

## 2013-03-19 SURGERY — COLONOSCOPY WITH PROPOFOL
Anesthesia: Monitor Anesthesia Care

## 2013-03-19 MED ORDER — KETAMINE HCL 50 MG/ML IJ SOLN
INTRAMUSCULAR | Status: DC | PRN
  Administered 2013-03-19: 25 mg via INTRAMUSCULAR

## 2013-03-19 MED ORDER — KETAMINE HCL 50 MG/ML IJ SOLN
INTRAMUSCULAR | Status: AC
  Filled 2013-03-19: qty 10

## 2013-03-19 MED ORDER — LACTATED RINGERS IV SOLN
INTRAVENOUS | Status: DC
  Administered 2013-03-19: 1000 mL via INTRAVENOUS

## 2013-03-19 MED ORDER — PROPOFOL 10 MG/ML IV BOLUS
INTRAVENOUS | Status: AC
  Filled 2013-03-19: qty 20

## 2013-03-19 MED ORDER — PROPOFOL INFUSION 10 MG/ML OPTIME
INTRAVENOUS | Status: DC | PRN
  Administered 2013-03-19: 80 ug/kg/min via INTRAVENOUS

## 2013-03-19 MED ORDER — MIDAZOLAM HCL 2 MG/2ML IJ SOLN
INTRAMUSCULAR | Status: AC
  Filled 2013-03-19: qty 2

## 2013-03-19 MED ORDER — MIDAZOLAM HCL 5 MG/5ML IJ SOLN
INTRAMUSCULAR | Status: DC | PRN
  Administered 2013-03-19: 2 mg via INTRAVENOUS

## 2013-03-19 MED ORDER — SODIUM CHLORIDE 0.9 % IV SOLN: INTRAVENOUS | Status: DC

## 2013-03-19 SURGICAL SUPPLY — 21 items

## 2013-03-19 NOTE — Addendum Note (Signed)
Addended by: Willis Modena on: 03/19/2013 08:43 AM   Modules accepted: Orders

## 2013-03-19 NOTE — Transfer of Care (Signed)
Immediate Anesthesia Transfer of Care Note  Patient: Teresa Escobar  Procedure(s) Performed: Procedure(s): COLONOSCOPY WITH PROPOFOL (N/A)  Patient Location: PACU  Anesthesia Type:MAC  Level of Consciousness: sedated  Airway & Oxygen Therapy: Patient Spontanous Breathing and Patient connected to nasal cannula oxygen  Post-op Assessment: Report given to PACU RN and Post -op Vital signs reviewed and stable  Post vital signs: Reviewed and stable  Complications: No apparent anesthesia complications

## 2013-03-19 NOTE — Anesthesia Postprocedure Evaluation (Signed)
  Anesthesia Post-op Note  Patient: Teresa Escobar  Procedure(s) Performed: Procedure(s) (LRB): COLONOSCOPY WITH PROPOFOL (N/A)  Patient Location: PACU  Anesthesia Type: MAC  Level of Consciousness: awake and alert   Airway and Oxygen Therapy: Patient Spontanous Breathing  Post-op Pain: mild  Post-op Assessment: Post-op Vital signs reviewed, Patient's Cardiovascular Status Stable, Respiratory Function Stable, Patent Airway and No signs of Nausea or vomiting  Last Vitals:  Filed Vitals:   03/19/13 1030  BP: 135/85  Temp:   Resp: 7    Post-op Vital Signs: stable   Complications: No apparent anesthesia complications

## 2013-03-19 NOTE — Op Note (Signed)
[facility] [address] [place], [address]   COLONOSCOPY PROCEDURE REPORT  PATIENT: Teresa Escobar, Teresa Escobar  MR#: [id number] BIRTHDATE: Jan 06, 1981 , 32  yrs. old GENDER: Female ENDOSCOPIST: Willis Modena, MD REFERRED XB:JYNWG Hedrick, M.D. PROCEDURE DATE:  03/19/2013 PROCEDURE:   Colonoscopy, diagnostic ASA CLASS:   Class II INDICATIONS:blood in stool, abnormal CT scan (sigmoid colon thickening). MEDICATIONS: MAC sedation, administered by CRNA  DESCRIPTION OF PROCEDURE:   After the risks benefits and alternatives of the procedure were thoroughly explained, informed consent was obtained.  A digital rectal exam revealed no abnormalities of the rectum.   The Pentax Ped Colon [id number] endoscope was introduced through the anus and advanced to the cecum, which was identified by both the appendix and ileocecal valve. No adverse events experienced.   The quality of the prep was fair.  The instrument was then slowly withdrawn as the colon was fully examined.    Findings:  Digital rectal exam normal.  Prep quality fair; diminutive or subtle sessile lesions could easily have been missed. No polyps, masses, vascular ectasias, or inflammatory changes were seen. No abnormalities were seen in the sigmoid colon, where possible thickening was suggested on CT scan.      Retroflexed view of rectum was normal.   .  The scope was withdrawn and the procedure completed.  ENDOSCOPIC IMPRESSION:     Fair prep, otherwise normal colonoscopy. No evidence of Crohn's disease.  RECOMMENDATIONS:     1.  Watch for potential complications of procedure. 2.  Follow-up with [facility] in 6-8 weeks for ongoing management of her GI symptoms.  eSigned:  Willis Modena, MD 03/19/2013 10:20 AM   cc:

## 2013-03-19 NOTE — H&P (Signed)
Patient interval history reviewed.  Patient examined again.  There has been no change from documented H/P dated 03/06/13 (scanned into chart from our office) except as documented above.  Assessment:  1.  Sigmoid colon thickening on CT. 2.  Blood in stool.  Plan:  1.  Colonoscopy. 2.  Risks (bleeding, infection, bowel perforation that could require surgery, sedation-related changes in cardiopulmonary systems), benefits (identification and possible treatment of source of symptoms, exclusion of certain causes of symptoms), and alternatives (watchful waiting, radiographic imaging studies, empiric medical treatment) of colonoscopy were explained to patient/family in detail and patient wishes to proceed.

## 2013-03-19 NOTE — Anesthesia Preprocedure Evaluation (Addendum)
Anesthesia Evaluation  Patient identified by MRN, date of birth, ID band Patient awake    Reviewed: Allergy & Precautions, H&P , NPO status , Patient's Chart, lab work & pertinent test results  History of Anesthesia Complications (+) PONV  Airway Mallampati: II TM Distance: >3 FB Neck ROM: full    Dental no notable dental hx. (+) Teeth Intact and Dental Advisory Given   Pulmonary neg pulmonary ROS, Current Smoker,  breath sounds clear to auscultation  Pulmonary exam normal       Cardiovascular Exercise Tolerance: Good negative cardio ROS  Rhythm:regular Rate:Normal     Neuro/Psych negative neurological ROS  negative psych ROS   GI/Hepatic negative GI ROS, Neg liver ROS, GERD-  Medicated and Controlled,  Endo/Other  negative endocrine ROS  Renal/GU negative Renal ROS  negative genitourinary   Musculoskeletal   Abdominal   Peds  Hematology negative hematology ROS (+)   Anesthesia Other Findings   Reproductive/Obstetrics negative OB ROS                          Anesthesia Physical Anesthesia Plan  ASA: II  Anesthesia Plan: MAC   Post-op Pain Management:    Induction:   Airway Management Planned: Simple Face Mask  Additional Equipment:   Intra-op Plan:   Post-operative Plan:   Informed Consent: I have reviewed the patients History and Physical, chart, labs and discussed the procedure including the risks, benefits and alternatives for the proposed anesthesia with the patient or authorized representative who has indicated his/her understanding and acceptance.   Dental Advisory Given  Plan Discussed with: CRNA and Surgeon  Anesthesia Plan Comments:         Anesthesia Quick Evaluation

## 2013-03-20 ENCOUNTER — Encounter (HOSPITAL_COMMUNITY): Payer: Self-pay | Admitting: Gastroenterology

## 2013-03-27 ENCOUNTER — Other Ambulatory Visit: Payer: Self-pay | Admitting: Legal Medicine

## 2013-03-27 DIAGNOSIS — M549 Dorsalgia, unspecified: Secondary | ICD-10-CM

## 2013-03-31 ENCOUNTER — Other Ambulatory Visit: Payer: Medicaid Other

## 2013-04-03 ENCOUNTER — Other Ambulatory Visit: Payer: Self-pay | Admitting: *Deleted

## 2013-04-03 DIAGNOSIS — M549 Dorsalgia, unspecified: Secondary | ICD-10-CM

## 2013-04-08 ENCOUNTER — Other Ambulatory Visit: Payer: Self-pay | Admitting: *Deleted

## 2013-04-08 ENCOUNTER — Other Ambulatory Visit: Payer: Medicaid Other

## 2013-04-08 ENCOUNTER — Ambulatory Visit
Admission: RE | Admit: 2013-04-08 | Discharge: 2013-04-08 | Disposition: A | Discharge status: Home / Self Care | Payer: Medicaid Other | Source: Ambulatory Visit | Attending: Legal Medicine | Admitting: Legal Medicine

## 2013-04-08 DIAGNOSIS — M549 Dorsalgia, unspecified: Secondary | ICD-10-CM

## 2013-04-08 MED ORDER — METHYLPREDNISOLONE ACETATE 40 MG/ML INJ SUSP (RADIOLOG
120.0000 mg | Freq: Once | INTRAMUSCULAR | Status: AC
  Administered 2013-04-08: 120 mg via INTRA_ARTICULAR

## 2013-04-08 MED ORDER — IOHEXOL 180 MG/ML  SOLN
1.0000 mL | Freq: Once | INTRAMUSCULAR | Status: AC | PRN
  Administered 2013-04-08: 1 mL via INTRA_ARTICULAR

## 2013-04-15 ENCOUNTER — Ambulatory Visit: Payer: Medicaid Other

## 2013-04-18 ENCOUNTER — Inpatient Hospital Stay (EMERGENCY_DEPARTMENT_HOSPITAL)
Admission: AD | Admit: 2013-04-18 | Discharge: 2013-04-18 | Disposition: A | Discharge status: Home / Self Care | Payer: Medicaid Other | Source: Ambulatory Visit | Attending: Obstetrics and Gynecology | Admitting: Obstetrics and Gynecology

## 2013-04-18 ENCOUNTER — Emergency Department (HOSPITAL_COMMUNITY)
Admission: EM | Admit: 2013-04-18 | Discharge: 2013-04-18 | Disposition: A | Discharge status: Home / Self Care | Payer: Medicaid Other | Attending: Emergency Medicine | Admitting: Emergency Medicine

## 2013-04-18 ENCOUNTER — Emergency Department (HOSPITAL_COMMUNITY): Payer: Medicaid Other

## 2013-04-18 ENCOUNTER — Encounter (HOSPITAL_COMMUNITY): Payer: Self-pay | Admitting: *Deleted

## 2013-04-18 ENCOUNTER — Encounter (HOSPITAL_COMMUNITY): Payer: Self-pay | Admitting: Emergency Medicine

## 2013-04-18 DIAGNOSIS — F17200 Nicotine dependence, unspecified, uncomplicated: Secondary | ICD-10-CM

## 2013-04-18 DIAGNOSIS — R079 Chest pain, unspecified: Secondary | ICD-10-CM

## 2013-04-18 DIAGNOSIS — Z87442 Personal history of urinary calculi: Secondary | ICD-10-CM | POA: Insufficient documentation

## 2013-04-18 DIAGNOSIS — Z792 Long term (current) use of antibiotics: Secondary | ICD-10-CM | POA: Insufficient documentation

## 2013-04-18 DIAGNOSIS — Z90710 Acquired absence of both cervix and uterus: Secondary | ICD-10-CM | POA: Insufficient documentation

## 2013-04-18 DIAGNOSIS — Z8611 Personal history of tuberculosis: Secondary | ICD-10-CM | POA: Insufficient documentation

## 2013-04-18 DIAGNOSIS — Z9089 Acquired absence of other organs: Secondary | ICD-10-CM | POA: Insufficient documentation

## 2013-04-18 DIAGNOSIS — R109 Unspecified abdominal pain: Secondary | ICD-10-CM

## 2013-04-18 DIAGNOSIS — N390 Urinary tract infection, site not specified: Secondary | ICD-10-CM

## 2013-04-18 DIAGNOSIS — F411 Generalized anxiety disorder: Secondary | ICD-10-CM

## 2013-04-18 DIAGNOSIS — R0602 Shortness of breath: Secondary | ICD-10-CM | POA: Insufficient documentation

## 2013-04-18 DIAGNOSIS — N12 Tubulo-interstitial nephritis, not specified as acute or chronic: Secondary | ICD-10-CM | POA: Insufficient documentation

## 2013-04-18 DIAGNOSIS — Z79899 Other long term (current) drug therapy: Secondary | ICD-10-CM | POA: Insufficient documentation

## 2013-04-18 DIAGNOSIS — M129 Arthropathy, unspecified: Secondary | ICD-10-CM | POA: Insufficient documentation

## 2013-04-18 DIAGNOSIS — Z8719 Personal history of other diseases of the digestive system: Secondary | ICD-10-CM | POA: Insufficient documentation

## 2013-04-18 DIAGNOSIS — R42 Dizziness and giddiness: Secondary | ICD-10-CM

## 2013-04-18 DIAGNOSIS — Z9851 Tubal ligation status: Secondary | ICD-10-CM | POA: Insufficient documentation

## 2013-04-18 DIAGNOSIS — B3731 Acute candidiasis of vulva and vagina: Secondary | ICD-10-CM | POA: Insufficient documentation

## 2013-04-18 DIAGNOSIS — B373 Candidiasis of vulva and vagina: Secondary | ICD-10-CM | POA: Insufficient documentation

## 2013-04-18 DIAGNOSIS — Z87448 Personal history of other diseases of urinary system: Secondary | ICD-10-CM | POA: Insufficient documentation

## 2013-04-18 LAB — CBC WITH DIFFERENTIAL/PLATELET
BASOS ABS: 0 10*3/uL (ref 0.0–0.1)
BASOS PCT: 0 % (ref 0–1)
Eosinophils Absolute: 0.1 10*3/uL (ref 0.0–0.7)
Eosinophils Relative: 1 % (ref 0–5)
HEMATOCRIT: 42 % (ref 36.0–46.0)
Hemoglobin: 15 g/dL (ref 12.0–15.0)
LYMPHS PCT: 26 % (ref 12–46)
Lymphs Abs: 2.4 10*3/uL (ref 0.7–4.0)
MCH: 31.8 pg (ref 26.0–34.0)
MCHC: 35.7 g/dL (ref 30.0–36.0)
MCV: 89 fL (ref 78.0–100.0)
Monocytes Absolute: 0.5 10*3/uL (ref 0.1–1.0)
Monocytes Relative: 5 % (ref 3–12)
NEUTROS PCT: 67 % (ref 43–77)
Neutro Abs: 6.2 10*3/uL (ref 1.7–7.7)
Platelets: 193 10*3/uL (ref 150–400)
RBC: 4.72 MIL/uL (ref 3.87–5.11)
RDW: 12 % (ref 11.5–15.5)
WBC: 9.1 10*3/uL (ref 4.0–10.5)

## 2013-04-18 LAB — COMPREHENSIVE METABOLIC PANEL
ALBUMIN: 3.9 g/dL (ref 3.5–5.2)
ALK PHOS: 76 U/L (ref 39–117)
ALT: 16 U/L (ref 0–35)
AST: 14 U/L (ref 0–37)
BILIRUBIN TOTAL: 0.6 mg/dL (ref 0.3–1.2)
BUN: 10 mg/dL (ref 6–23)
CHLORIDE: 100 meq/L (ref 96–112)
CO2: 22 meq/L (ref 19–32)
Calcium: 9.1 mg/dL (ref 8.4–10.5)
Creatinine, Ser: 0.66 mg/dL (ref 0.50–1.10)
GFR calc Af Amer: >90 mL/min (ref >90)
Glucose, Bld: 89 mg/dL (ref 70–99)
POTASSIUM: 4.2 meq/L (ref 3.7–5.3)
SODIUM: 137 meq/L (ref 137–147)
Total Protein: 7.5 g/dL (ref 6.0–8.3)

## 2013-04-18 LAB — URINALYSIS, ROUTINE W REFLEX MICROSCOPIC
Bilirubin Urine: NEGATIVE
GLUCOSE, UA: NEGATIVE mg/dL
Ketones, ur: NEGATIVE mg/dL
Leukocytes, UA: NEGATIVE
Nitrite: POSITIVE — AB
PH: 7 (ref 5.0–8.0)
PROTEIN: NEGATIVE mg/dL
Specific Gravity, Urine: 1.015 (ref 1.005–1.030)
Urobilinogen, UA: 0.2 mg/dL (ref 0.0–1.0)

## 2013-04-18 LAB — WET PREP, GENITAL
CLUE CELLS WET PREP: NONE SEEN
TRICH WET PREP: NONE SEEN

## 2013-04-18 LAB — URINE MICROSCOPIC-ADD ON

## 2013-04-18 LAB — LIPASE, BLOOD: Lipase: 28 U/L (ref 11–59)

## 2013-04-18 LAB — TROPONIN I: Troponin I: <0.3 ng/mL (ref <0.30)

## 2013-04-18 MED ORDER — OXYCODONE HCL 5 MG PO TABS: 5.0000 mg | ORAL_TABLET | ORAL | Status: AC | PRN

## 2013-04-18 MED ORDER — SODIUM CHLORIDE 0.9 % IV BOLUS (SEPSIS)
1000.0000 mL | Freq: Once | INTRAVENOUS | Status: AC
  Administered 2013-04-18: 1000 mL via INTRAVENOUS

## 2013-04-18 MED ORDER — FLUCONAZOLE 150 MG PO TABS: 150.0000 mg | ORAL_TABLET | Freq: Every day | ORAL | Status: AC

## 2013-04-18 MED ORDER — HYDROMORPHONE HCL PF 1 MG/ML IJ SOLN
1.0000 mg | Freq: Once | INTRAMUSCULAR | Status: AC
  Administered 2013-04-18: 1 mg via INTRAVENOUS
  Filled 2013-04-18: qty 1

## 2013-04-18 MED ORDER — ACETAMINOPHEN 325 MG PO TABS
650.0000 mg | ORAL_TABLET | Freq: Once | ORAL | Status: AC
  Administered 2013-04-18: 650 mg via ORAL
  Filled 2013-04-18 (×2): qty 2

## 2013-04-18 MED ORDER — ONDANSETRON HCL 4 MG/2ML IJ SOLN
4.0000 mg | Freq: Once | INTRAMUSCULAR | Status: AC
  Administered 2013-04-18: 4 mg via INTRAVENOUS
  Filled 2013-04-18: qty 2

## 2013-04-18 MED ORDER — CIPROFLOXACIN HCL 500 MG PO TABS: 500.0000 mg | ORAL_TABLET | Freq: Two times a day (BID) | ORAL | Status: DC

## 2013-04-18 MED ORDER — SULFAMETHOXAZOLE-TRIMETHOPRIM 800-160 MG PO TABS: 1.0000 | ORAL_TABLET | Freq: Two times a day (BID) | ORAL | Status: AC

## 2013-04-18 NOTE — ED Notes (Signed)
Patient transported to X-ray & CT °

## 2013-04-18 NOTE — Discharge Instructions (Signed)
Read the information below.  Use the prescribed medication as directed.  Please discuss all new medications with your pharmacist.  You may return to the Emergency Department at any time for worsening condition or any new symptoms that concern you.  If you develop high fevers, worsening abdominal pain, uncontrolled vomiting, or are unable to tolerate fluids by mouth, return to the ER for a recheck.    Pyelonephritis, Adult Pyelonephritis is a kidney infection. In general, there are 2 main types of pyelonephritis:  Infections that come on quickly without any warning (acute pyelonephritis).  Infections that persist for a long period of time (chronic pyelonephritis). CAUSES  Two main causes of pyelonephritis are:  Bacteria traveling from the bladder to the kidney. This is a problem especially in pregnant women. The urine in the bladder can become filled with bacteria from multiple causes, including:  Inflammation of the prostate gland (prostatitis).  Sexual intercourse in females.  Bladder infection (cystitis).  Bacteria traveling from the bloodstream to the tissue part of the kidney. Problems that may increase your risk of getting a kidney infection include:  Diabetes.  Kidney stones or bladder stones.  Cancer.  Catheters placed in the bladder.  Other abnormalities of the kidney or ureter. SYMPTOMS   Abdominal pain.  Pain in the side or flank area.  Fever.  Chills.  Upset stomach.  Blood in the urine (dark urine).  Frequent urination.  Strong or persistent urge to urinate.  Burning or stinging when urinating. DIAGNOSIS  Your caregiver may diagnose your kidney infection based on your symptoms. A urine sample may also be taken. TREATMENT  In general, treatment depends on how severe the infection is.   If the infection is mild and caught early, your caregiver may treat you with oral antibiotics and send you home.  If the infection is more severe, the bacteria may  have gotten into the bloodstream. This will require intravenous (IV) antibiotics and a hospital stay. Symptoms may include:  High fever.  Severe flank pain.  Shaking chills.  Even after a hospital stay, your caregiver may require you to be on oral antibiotics for a period of time.  Other treatments may be required depending upon the cause of the infection. HOME CARE INSTRUCTIONS   Take your antibiotics as directed. Finish them even if you start to feel better.  Make an appointment to have your urine checked to make sure the infection is gone.  Drink enough fluids to keep your urine clear or pale yellow.  Take medicines for the bladder if you have urgency and frequency of urination as directed by your caregiver. SEEK IMMEDIATE MEDICAL CARE IF:   You have a fever or persistent symptoms for more than 2-3 days.  You have a fever and your symptoms suddenly get worse.  You are unable to take your antibiotics or fluids.  You develop shaking chills.  You experience extreme weakness or fainting.  There is no improvement after 2 days of treatment. MAKE SURE YOU:  Understand these instructions.  Will watch your condition.  Will get help right away if you are not doing well or get worse. Document Released: 04/03/2005 Document Revised: 10/03/2011 Document Reviewed: 09/07/2010 [facility] Patient Information 2014 [place], [place].  Candida Infection, Adult A candida infection (also called yeast, fungus and Monilia infection) is an overgrowth of yeast that can occur anywhere on the body. A yeast infection commonly occurs in warm, moist body areas. Usually, the infection remains localized but can spread to become a  systemic infection. A yeast infection may be a sign of a more severe disease such as diabetes, leukemia, or AIDS. A yeast infection can occur in both men and women. In women, Candida vaginitis is a vaginal infection. It is one of the most common causes of vaginitis. Men usually do  not have symptoms or know they have an infection until other problems develop. Men may find out they have a yeast infection because their sex partner has a yeast infection. Uncircumcised men are more likely to get a yeast infection than circumcised men. This is because the uncircumcised glans is not exposed to air and does not remain as dry as that of a circumcised glans. Older adults may develop yeast infections around dentures. CAUSES  Women  Antibiotics.  Steroid medication taken for a long time.  Being overweight (obese).  Diabetes.  Poor immune condition.  Certain serious medical conditions.  Immune suppressive medications for organ transplant patients.  Chemotherapy.  Pregnancy.  Menstration.  Stress and fatigue.  Intravenous drug use.  Oral contraceptives.  Wearing tight-fitting clothes in the crotch area.  Catching it from a sex partner who has a yeast infection.  Spermicide.  Intravenous, urinary, or other catheters. Men  Catching it from a sex partner who has a yeast infection.  Having oral or anal sex with a person who has the infection.  Spermicide.  Diabetes.  Antibiotics.  Poor immune system.  Medications that suppress the immune system.  Intravenous drug use.  Intravenous, urinary, or other catheters. SYMPTOMS  Women  Thick, white vaginal discharge.  Vaginal itching.  Redness and swelling in and around the vagina.  Irritation of the lips of the vagina and perineum.  Blisters on the vaginal lips and perineum.  Painful sexual intercourse.  Low blood sugar (hypoglycemia).  Painful urination.  Bladder infections.  Intestinal problems such as constipation, indigestion, bad breath, bloating, increase in gas, diarrhea, or loose stools. Men  Men may develop intestinal problems such as constipation, indigestion, bad breath, bloating, increase in gas, diarrhea, or loose stools.  Dry, cracked skin on the penis with itching or  discomfort.  Jock itch.  Dry, flaky skin.  Athlete's foot.  Hypoglycemia. DIAGNOSIS  Women  A history and an exam are performed.  The discharge may be examined under a microscope.  A culture may be taken of the discharge. Men  A history and an exam are performed.  Any discharge from the penis or areas of cracked skin will be looked at under the microscope and cultured.  Stool samples may be cultured. TREATMENT  Women  Vaginal antifungal suppositories and creams.  Medicated creams to decrease irritation and itching on the outside of the vagina.  Warm compresses to the perineal area to decrease swelling and discomfort.  Oral antifungal medications.  Medicated vaginal suppositories or cream for repeated or recurrent infections.  Wash and dry the irritation areas before applying the cream.  Eating yogurt with lactobacillus may help with prevention and treatment.  Sometimes painting the vagina with gentian violet solution may help if creams and suppositories do not work. Men  Antifungal creams and oral antifungal medications.  Sometimes treatment must continue for 30 days after the symptoms go away to prevent recurrence. HOME CARE INSTRUCTIONS  Women  Use cotton underwear and avoid tight-fitting clothing.  Avoid colored, scented toilet paper and deodorant tampons or pads.  Do not douche.  Keep your diabetes under control.  Finish all the prescribed medications.  Keep your skin clean and dry.  Consume milk or yogurt with lactobacillus active culture regularly. If you get frequent yeast infections and think that is what the infection is, there are over-the-counter medications that you can get. If the infection does not show healing in 3 days, talk to your caregiver.  Tell your sex partner you have a yeast infection. Your partner may need treatment also, especially if your infection does not clear up or recurs. Men  Keep your skin clean and dry.  Keep your  diabetes under control.  Finish all prescribed medications.  Tell your sex partner that you have a yeast infection so they can be treated if necessary. SEEK MEDICAL CARE IF:   Your symptoms do not clear up or worsen in one week after treatment.  You have an oral temperature above 102 F (38.9 C).  You have trouble swallowing or eating for a prolonged time.  You develop blisters on and around your vagina.  You develop vaginal bleeding and it is not your menstrual period.  You develop abdominal pain.  You develop intestinal problems as mentioned above.  You get weak or lightheaded.  You have painful or increased urination.  You have pain during sexual intercourse. MAKE SURE YOU:   Understand these instructions.  Will watch your condition.  Will get help right away if you are not doing well or get worse. Document Released: 05/11/2004 Document Revised: 06/26/2011 Document Reviewed: 08/23/2009 [facility] Patient Information 2014 [place], [place].

## 2013-04-18 NOTE — ED Notes (Signed)
Pt also c/o n/v. But diarrhea.

## 2013-04-18 NOTE — MAU Provider Note (Signed)
Attestation of Attending Supervision of Advanced Practitioner (CNM/NP): Evaluation and management procedures were performed by the Advanced Practitioner under my supervision and collaboration.  I have reviewed the Advanced Practitioner's note and chart, and I agree with the management and plan.  Eban Weick 04/18/2013 10:48 PM

## 2013-04-18 NOTE — ED Provider Notes (Signed)
CSN: [id number]     Arrival date & time 04/18/13  1529 History   First MD Initiated Contact with Patient 04/18/13 1726     Chief Complaint  Patient presents with  . Flank Pain    right  . Dysuria  . Emesis   (Consider location/radiation/quality/duration/timing/severity/associated sxs/prior Treatment) The history is provided by the patient.   Patient presents with right flank pain that is sharp, aching, radiating to her right RLQ with associated sharp pains "in my bladder," associated dysuria.  States this feels like prior kidney stone.  Pt also recently treated for BV, with continued abnormal vaginal discharge. She has developed pain in her chest that feels like she is being electrocuted, with occasional SOB, last a few nights ago that woke her from sleep.  She is also lightheaded, has had chills.  Pt was seen at [facility] this morning where she had a normal EKG, wet prep showing BV, and UA showing UTI.  Was taking percocet at home for pain, last dose (ran out) early this morning.    Past Medical History  Diagnosis Date  . Interstitial cystitis   . Gastritis   . Abnormal Pap smear   . Tuberculosis   . Complication of anesthesia     hard time keeping her asleep for surgery  . Arthritis   . GERD (gastroesophageal reflux disease)   . History of kidney stones   . PONV (postoperative nausea and vomiting)    Past Surgical History  Procedure Laterality Date  . Tonsillectomy    . Abdominal hysterectomy    . Cholecystectomy    . Tubal ligation    . Colonoscopy with propofol N/A 03/19/2013    Procedure: COLONOSCOPY WITH PROPOFOL;  Surgeon: Willis Modena, MD;  Location: [facility] ENDOSCOPY;  Service: Endoscopy;  Laterality: N/A;   No family history on file. History  Substance Use Topics  . Smoking status: Current Every Day Smoker -- 0.25 packs/day for 15 years    Types: Cigarettes  . Smokeless tobacco: Not on file  . Alcohol Use: No   OB History   Grav Para Term Preterm Abortions  TAB SAB Ect Mult Living   5 4 4  1  1   4      Review of Systems  Constitutional: Negative for fever.  Respiratory: Positive for shortness of breath. Negative for cough.   Cardiovascular: Positive for chest pain.  Gastrointestinal: Positive for nausea, vomiting and abdominal pain. Negative for diarrhea, constipation and blood in stool.  Genitourinary: Positive for dysuria, urgency, frequency, flank pain and vaginal discharge. Negative for vaginal bleeding.  All other systems reviewed and are negative.    Allergies  Hydrocodone-acetaminophen; Morphine and related; Nsaids; and Other  Home Medications   Current Outpatient Rx  Name  Route  Sig  Dispense  Refill  . acetaminophen (TYLENOL) 325 MG tablet   Oral   Take 650 mg by mouth every 6 (six) hours as needed for mild pain or moderate pain.          Marland Kitchen amphetamine-dextroamphetamine (ADDERALL XR) 20 MG 24 hr capsule   Oral   Take 20 mg by mouth every morning.         Marland Kitchen amphetamine-dextroamphetamine (ADDERALL) 10 MG tablet   Oral   Take 10 mg by mouth daily.         . ciprofloxacin (CIPRO) 500 MG tablet   Oral   Take 1 tablet (500 mg total) by mouth 2 (two) times daily.   10  tablet   0   . fluconazole (DIFLUCAN) 150 MG tablet   Oral   Take 1 tablet (150 mg total) by mouth daily.   1 tablet   1   . meclizine (ANTIVERT) 25 MG tablet   Oral   Take 25 mg by mouth 3 (three) times daily as needed for dizziness.         Marland Kitchen. oxyCODONE (OXY IR/ROXICODONE) 5 MG immediate release tablet   Oral   Take 5 mg by mouth every 6 (six) hours as needed for moderate pain or severe pain.          . promethazine (PHENERGAN) 25 MG tablet   Oral   Take 25 mg by mouth every 6 (six) hours as needed for nausea or vomiting.         . propranolol (INDERAL) 10 MG tablet   Oral   Take 10 mg by mouth 2 (two) times daily.          BP 167/96  Pulse 89  Temp(Src) 98.7 F (37.1 C) (Oral)  Resp 18  SpO2 100%  LMP  01/12/2012 Physical Exam  Nursing note and vitals reviewed. Constitutional: She appears well-developed and well-nourished. No distress.  Cardiovascular: Normal rate and regular rhythm.   Pulmonary/Chest: Effort normal and breath sounds normal. No respiratory distress. She has no wheezes. She has no rales. She exhibits no tenderness.  Abdominal: Soft. She exhibits no distension. There is tenderness in the right upper quadrant, right lower quadrant and suprapubic area. There is no rebound, no guarding and no CVA tenderness.  Neurological: She is alert.  Skin: She is not diaphoretic.    ED Course  Procedures (including critical care time) Labs Review Labs Reviewed  CBC WITH DIFFERENTIAL  COMPREHENSIVE METABOLIC PANEL  LIPASE, BLOOD  TROPONIN I   Imaging Review Ct Abdomen Pelvis Wo Contrast  04/18/2013   CLINICAL DATA:  Right flank pain  EXAM: CT ABDOMEN AND PELVIS WITHOUT CONTRAST  TECHNIQUE: Multidetector CT imaging of the abdomen and pelvis was performed following the standard protocol without intravenous contrast.  COMPARISON:  02/23/2013.  FINDINGS: The lung bases are clear.  No renal, ureteral, or bladder calculi. No obstructive uropathy. No perinephric stranding is seen. The kidneys are symmetric in size without evidence for exophytic mass. The bladder is unremarkable. There is evidence of prior hysterectomy. There is no adnexal mass.  The liver demonstrates no focal abnormality. The gallbladder is surgically absent. The spleen demonstrates no focal abnormality. The adrenal glands and pancreas are normal.  The unopacified stomach, duodenum, small intestine and large intestine are unremarkable, but evaluation is limited by lack of oral contrast. There is a normal caliber appendix in the right lower quadrant without periappendiceal inflammatory changes. There is no pneumoperitoneum, pneumatosis, or portal venous gas. There is no abdominal or pelvic free fluid. There is no lymphadenopathy.  The  abdominal aorta is normal in caliber.  There is a unilateral right L5 pars interarticularis defect. Mild degenerative changes of bilateral SI joints.  IMPRESSION: 1. No urolithiasis or obstructive uropathy. 2. Normal appendix.   Electronically Signed   By: Elige KoHetal  Patel   On: 04/18/2013 18:48   Dg Chest 2 View  04/18/2013   CLINICAL DATA:  Flank pain.  History of smoking.  EXAM: CHEST  2 VIEW  COMPARISON:  Chest x-ray 11/04/2012.  FINDINGS: Small calcified granuloma projecting over the right upper lobe is unchanged. Lung volumes are normal. No consolidative airspace disease. No pleural effusions. No pneumothorax. No  pulmonary nodule or mass noted. Pulmonary vasculature and the cardiomediastinal silhouette are within normal limits.  IMPRESSION: 1.  No radiographic evidence of acute cardiopulmonary disease.   Electronically Signed   By: Trudie Reed M.D.   On: 04/18/2013 18:57    EKG Interpretation   None       MDM   1. Pyelonephritis   2. Vaginal yeast infection     Pt with dysuria, abnormal vaginal discharge, right flank pain that feels like prior kidney stone. Also with central chest pain with occasional SOB.  EKG done at [facility] hospital reviewed by me, unremarkable.  CXR, troponin negative. Pain is atypical.  Doubt ACS. UA positive for UTI, likely early pyelonephritis given right flank pain.  Pt also has yeast infection (per wet prep and UA done at [facility] hospital earlier today, reviewed in EPIC).  D/C home with oxycodone (pt takes 4g tylenol as part of her home medication regimen) and bactrim.  Urine culture sent.  PCP follow up.  Discussed result, findings, treatment, and follow up  with patient.  Pt given return precautions.  Pt verbalizes understanding and agrees with plan.       Trixie Dredge, PA-C 04/18/13 2349

## 2013-04-18 NOTE — MAU Note (Signed)
Few days ago started having flank pain, has AS, thought it might be related, next day started having LLQ pain, cramping. Was recently dx and treated for BV- though doesn't think is gone and sex has been painful.  Night before last became SOB and would wake up gasping, yesterday wasn't feeling well, took BP first was low, then was high.  Was recently put on new med to control pulse rate. Has been really dizzy at times

## 2013-04-18 NOTE — MAU Note (Signed)
Became dizzy when leaving triage to go to bathroom, assisted to br, sitting w/ head down until passed.   Tech with pt, instructed on emergency light.

## 2013-04-18 NOTE — MAU Note (Signed)
Patient called RN into room to report occurrence of chest pressure; Sats 100%. No diaphoresis noted at this time. Denies radiation to arm or neck. NP made aware; Respiratory called for EKG.

## 2013-04-18 NOTE — Discharge Instructions (Signed)
Candidal Vulvovaginitis °Candidal vulvovaginitis is an infection of the vagina and vulva. The vulva is the skin around the opening of the vagina. This may cause itching and discomfort in and around the vagina.  °HOME CARE °· Only take medicine as told by your doctor. °· Do not have sex (intercourse) until the infection is healed or as told by your doctor. °· Practice safe sex. °· Tell your sex partner about your infection. °· Do not douche or use tampons. °· Wear cotton underwear. Do not wear tight pants or panty hose. °· Eat yogurt. This may help treat and prevent yeast infections. °GET HELP RIGHT AWAY IF:  °· You have a fever. °· Your problems get worse during treatment or do not get better in 3 days. °· You have discomfort, irritation, or itching in your vagina or vulva area. °· You have pain after sex. °· You start to get belly (abdominal) pain. °MAKE SURE YOU: °· Understand these instructions. °· Will watch your condition. °· Will get help right away if you are not doing well or get worse. °Document Released: 06/30/2008 Document Revised: 06/26/2011 Document Reviewed: 06/30/2008 °ExitCare® Patient Information ©2014 [employer]. °Urinary Tract Infection °Urinary tract infections (UTIs) can develop anywhere along your urinary tract. Your urinary tract is your body's drainage system for removing wastes and extra water. Your urinary tract includes two kidneys, two ureters, a bladder, and a urethra. Your kidneys are a pair of bean-shaped organs. Each kidney is about the size of your fist. They are located below your ribs, one on each side of your spine. °CAUSES °Infections are caused by microbes, which are microscopic organisms, including fungi, viruses, and bacteria. These organisms are so small that they can only be seen through a microscope. Bacteria are the microbes that most commonly cause UTIs. °SYMPTOMS  °Symptoms of UTIs may vary by age and gender of the patient and by the location of the infection.  Symptoms in young women typically include a frequent and intense urge to urinate and a painful, burning feeling in the bladder or urethra during urination. Older women and men are more likely to be tired, shaky, and weak and have muscle aches and abdominal pain. A fever may mean the infection is in your kidneys. Other symptoms of a kidney infection include pain in your back or sides below the ribs, nausea, and vomiting. °DIAGNOSIS °To diagnose a UTI, your caregiver will ask you about your symptoms. Your caregiver also will ask to provide a urine sample. The urine sample will be tested for bacteria and white blood cells. White blood cells are made by your body to help fight infection. °TREATMENT  °Typically, UTIs can be treated with medication. Because most UTIs are caused by a bacterial infection, they usually can be treated with the use of antibiotics. The choice of antibiotic and length of treatment depend on your symptoms and the type of bacteria causing your infection. °HOME CARE INSTRUCTIONS °· If you were prescribed antibiotics, take them exactly as your caregiver instructs you. Finish the medication even if you feel better after you have only taken some of the medication. °· Drink enough water and fluids to keep your urine clear or pale yellow. °· Avoid caffeine, tea, and carbonated beverages. They tend to irritate your bladder. °· Empty your bladder often. Avoid holding urine for long periods of time. °· Empty your bladder before and after sexual intercourse. °· After a bowel movement, women should cleanse from front to back. Use each tissue only once. °  SEEK MEDICAL CARE IF:   You have back pain.  You develop a fever.  Your symptoms do not begin to resolve within 3 days. SEEK IMMEDIATE MEDICAL CARE IF:   You have severe back pain or lower abdominal pain.  You develop chills.  You have nausea or vomiting.  You have continued burning or discomfort with urination. MAKE SURE YOU:   Understand  these instructions.  Will watch your condition.  Will get help right away if you are not doing well or get worse. Document Released: 01/11/2005 Document Revised: 10/03/2011 Document Reviewed: 05/12/2011 [facility] Patient Information 2014 [place], [place].

## 2013-04-18 NOTE — ED Notes (Signed)
Pt c/o right flank pain that radiates to RLQ, pt been treated with antibiotics for UTI but doesn't seem to help the symptoms. [facility] but was told that they only help "women problems and if you need more help go somewhere else".

## 2013-04-18 NOTE — MAU Provider Note (Signed)
History     CSN: [id number]  Arrival date and time: 04/18/13 1219   First Provider Initiated Contact with Patient 04/18/13 1321      Chief Complaint  Patient presents with  . Flank Pain  . Abdominal Pain  . Chest Pain  . Dizziness   HPI Comments: Teresa Escobar 33 y.o. [id number] presents to MAU with multiple " pain " complaints . She has long convoluted stories on each body system. Painful areas include chest, abdomen, flank, and pelvic. She has had to stop having sex with her husband due to pain. She is on Suboxone for chronic pain and drug abuse. She is have a bacterial infection recently. She has had a hysterectomy in past.    Flank Pain Associated symptoms include abdominal pain and chest pain.  Abdominal Pain  Chest Pain  Associated symptoms include abdominal pain, back pain and dizziness.  Dizziness Associated symptoms include abdominal pain and chest pain.      Past Medical History  Diagnosis Date  . Interstitial cystitis   . Gastritis   . Abnormal Pap smear   . Tuberculosis   . Complication of anesthesia     hard time keeping her asleep for surgery  . Arthritis   . GERD (gastroesophageal reflux disease)   . History of kidney stones   . PONV (postoperative nausea and vomiting)     Past Surgical History  Procedure Laterality Date  . Tonsillectomy    . Abdominal hysterectomy    . Cholecystectomy    . Tubal ligation    . Colonoscopy with propofol N/A 03/19/2013    Procedure: COLONOSCOPY WITH PROPOFOL;  Surgeon: Willis ModenaWilliam Outlaw, MD;  Location: [facility];  Service: Endoscopy;  Laterality: N/A;    History reviewed. No pertinent family history.  History  Substance Use Topics  . Smoking status: Current Every Day Smoker -- 0.25 packs/day for 15 years    Types: Cigarettes  . Smokeless tobacco: Not on file  . Alcohol Use: No    Allergies:  Allergies  Allergen Reactions  . Hydrocodone-Acetaminophen     GI  . Morphine And Related     GI  . Nsaids    GI   . Other     IV tape burns skin    Prescriptions prior to admission  Medication Sig Dispense Refill  . acetaminophen (TYLENOL) 325 MG tablet Take 650 mg by mouth every 6 (six) hours as needed for mild pain or moderate pain.       Marland Kitchen. amphetamine-dextroamphetamine (ADDERALL XR) 20 MG 24 hr capsule Take 20 mg by mouth every morning.      Marland Kitchen. amphetamine-dextroamphetamine (ADDERALL) 10 MG tablet Take 10 mg by mouth daily.      Marland Kitchen. oxyCODONE (OXY IR/ROXICODONE) 5 MG immediate release tablet Take 5 mg by mouth every 6 (six) hours as needed for moderate pain or severe pain.       . promethazine (PHENERGAN) 25 MG tablet Take 25 mg by mouth every 6 (six) hours as needed for nausea or vomiting.      . propranolol (INDERAL) 10 MG tablet Take 10 mg by mouth 2 (two) times daily.        Review of Systems  Constitutional: Negative.   HENT: Negative.   Eyes: Negative.   Respiratory: Negative.   Cardiovascular: Positive for chest pain.  Gastrointestinal: Positive for abdominal pain.  Genitourinary: Positive for flank pain.  Musculoskeletal: Positive for back pain.  Skin: Negative.   Neurological: Positive for dizziness.  Psychiatric/Behavioral: Positive for substance abuse.       History of   Physical Exam   Blood pressure 138/94, pulse 98, temperature 98.5 F (36.9 C), temperature source Oral, resp. rate 10, height 5' 4.5" (1.638 m), weight 94.802 kg (209 lb), last menstrual period 01/12/2012, SpO2 100.00%.  Physical Exam  Constitutional: She is oriented to person, place, and time. She appears well-developed and well-nourished. No distress.  Disheveled  HENT:  Head: Normocephalic.  Eyes: Pupils are equal, round, and reactive to light.  Cardiovascular: Normal rate, regular rhythm and normal heart sounds.   Respiratory: Effort normal and breath sounds normal.  GI: Soft. Bowel sounds are normal. She exhibits no distension and no mass. There is no tenderness. There is no rebound and no guarding.   Genitourinary: Vagina normal.  Uterus and cervix missing  Musculoskeletal: Normal range of motion.  Neurological: She is alert and oriented to person, place, and time.  Skin: Skin is warm and dry.  Psychiatric: She has a normal mood and affect. Her behavior is normal. Judgment and thought content normal.   Results for orders placed during the hospital encounter of 04/18/13 (from the past 24 hour(s))  URINALYSIS, ROUTINE W REFLEX MICROSCOPIC     Status: Abnormal   Collection Time    04/18/13 12:46 PM      Result Value Range   Color, Urine YELLOW  YELLOW   APPearance CLOUDY (*) CLEAR   Specific Gravity, Urine 1.015  1.005 - 1.030   pH 7.0  5.0 - 8.0   Glucose, UA NEGATIVE  NEGATIVE mg/dL   Hgb urine dipstick MODERATE (*) NEGATIVE   Bilirubin Urine NEGATIVE  NEGATIVE   Ketones, ur NEGATIVE  NEGATIVE mg/dL   Protein, ur NEGATIVE  NEGATIVE mg/dL   Urobilinogen, UA 0.2  0.0 - 1.0 mg/dL   Nitrite POSITIVE (*) NEGATIVE   Leukocytes, UA NEGATIVE  NEGATIVE  URINE MICROSCOPIC-ADD ON     Status: Abnormal   Collection Time    04/18/13 12:46 PM      Result Value Range   Squamous Epithelial / LPF FEW (*) RARE   WBC, UA 0-2  <3 WBC/hpf   RBC / HPF 3-6  <3 RBC/hpf   Bacteria, UA FEW (*) RARE   Urine-Other AMORPHOUS URATES/PHOSPHATES    WET PREP, GENITAL     Status: Abnormal   Collection Time    04/18/13  1:35 PM      Result Value Range   Yeast Wet Prep HPF POC MODERATE (*) NONE SEEN   Trich, Wet Prep NONE SEEN  NONE SEEN   Clue Cells Wet Prep HPF POC NONE SEEN  NONE SEEN   WBC, Wet Prep HPF POC MODERATE (*) NONE SEEN      MAU Course  Procedures  MDM Wet prep, gc, chlamydia, EKG Tylenol for pain/ declined Toradol EKG/ WNL/ pt declined transfer to [facility] for further evaluation Assessment and Plan   A: UTI Vaginal Yeast Anxiety  P: Cipro 500 mg PO BID X 5 days Diflucan 150 mg po one time dose Increase fluids Address anxiety with PCP   Amaryllis Dyke Rubbie Battiest 04/18/2013, 1:49  PM

## 2013-04-20 LAB — GC/CHLAMYDIA PROBE AMP
CT PROBE, AMP APTIMA: NEGATIVE
GC PROBE AMP APTIMA: NEGATIVE

## 2013-04-28 NOTE — ED Provider Notes (Signed)
Medical screening examination/treatment/procedure(s) were performed by non-physician practitioner and as supervising physician I was immediately available for consultation/collaboration.  EKG Interpretation   None         Gianno Volner, MD 04/28/13 0756 

## 2013-11-07 ENCOUNTER — Ambulatory Visit: Payer: Self-pay | Admitting: Family Medicine

## 2014-01-09 ENCOUNTER — Emergency Department: Payer: Self-pay | Admitting: Emergency Medicine

## 2014-01-12 ENCOUNTER — Encounter: Payer: Self-pay | Admitting: Oncology

## 2014-01-14 IMAGING — US US PELVIS COMPLETE
1 series · 13 of 25 positions shown · non-contrast
Comparison: 11/20/2011 from [facility]

CLINICAL DATA: Pelvic pain.  Rule out tubo-ovarian abscess.

TRANSABDOMINAL AND TRANSVAGINAL ULTRASOUND OF PELVIS
DOPPLER ULTRASOUND OF OVARIES
TECHNIQUE: Both transabdominal and transvaginal ultrasound
examinations of the pelvis were performed. Transabdominal technique
was performed for global imaging of the pelvis including uterus,
ovaries, adnexal regions, and pelvic cul-de-sac.
It was necessary to proceed with endovaginal exam following the
transabdominal exam to visualize the ovary / adnexa.
Color and duplex Doppler ultrasound was utilized to evaluate blood
flow to the ovaries.

[Series 1: us pelvis complete · 0.25mm/px · 13 of 80 slices shown]
[im 1/80]
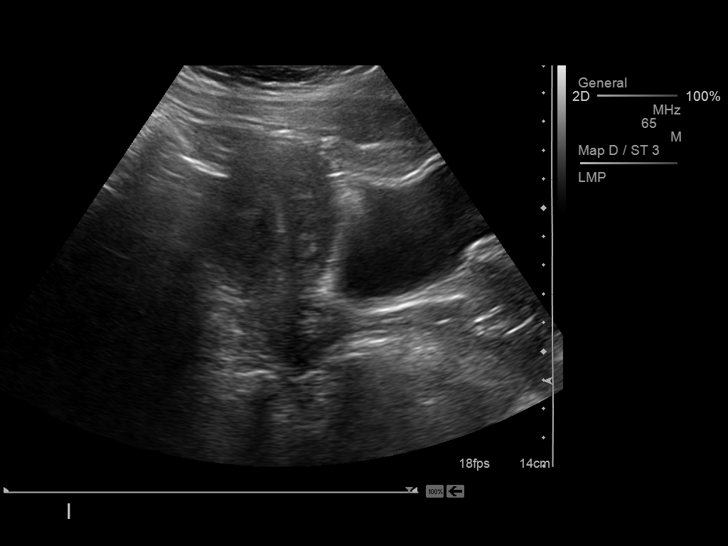
[im 7/80]
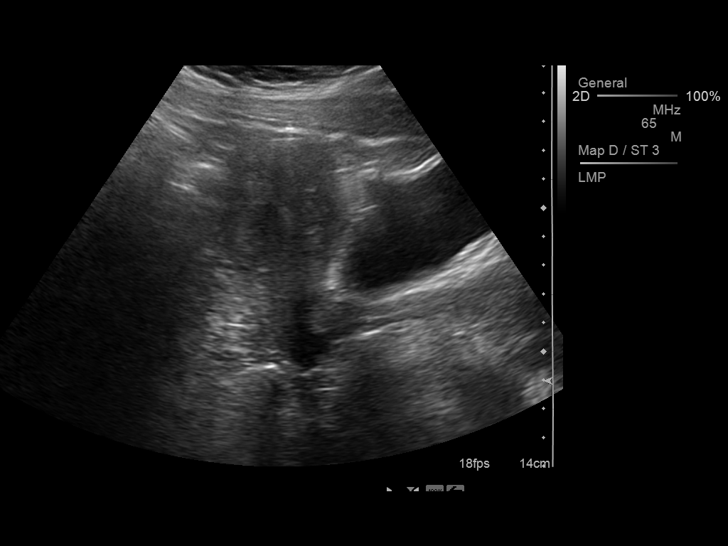
[im 14/80]
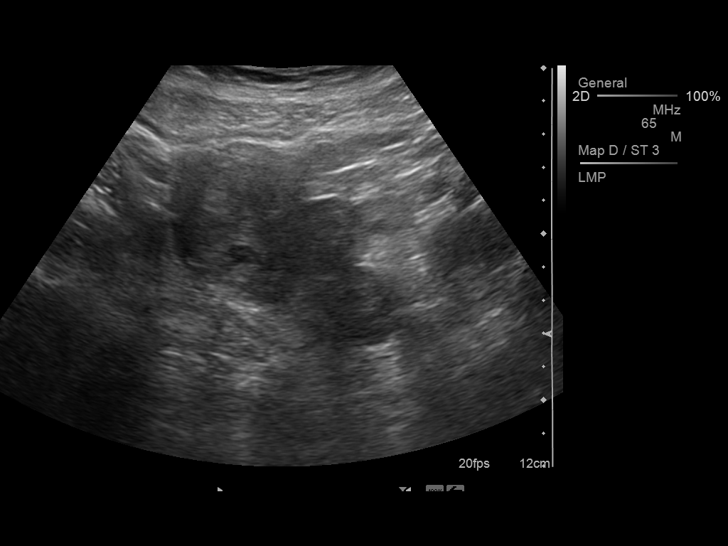
[im 20/80]
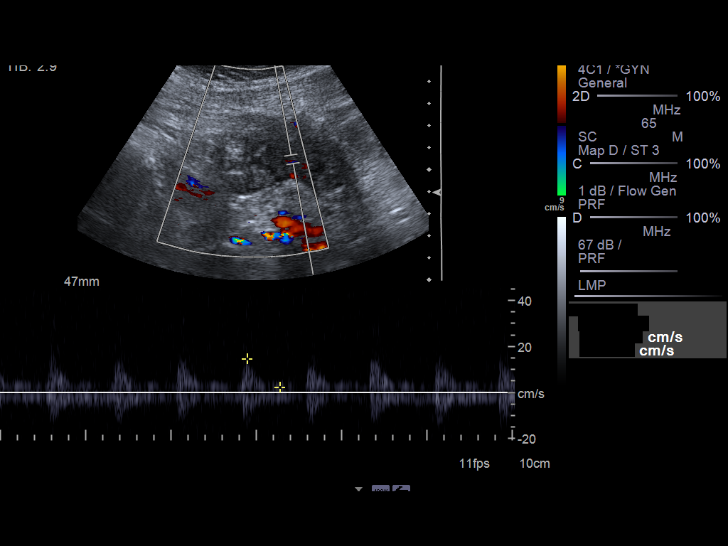
[im 27/80]
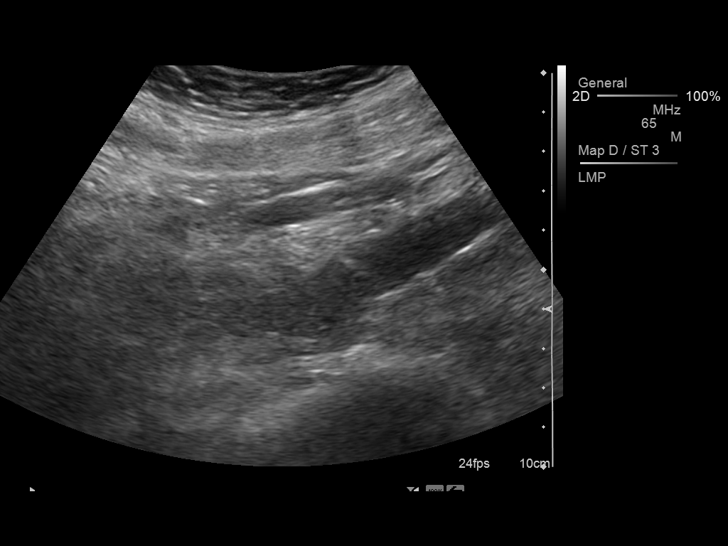
[im 33/80]
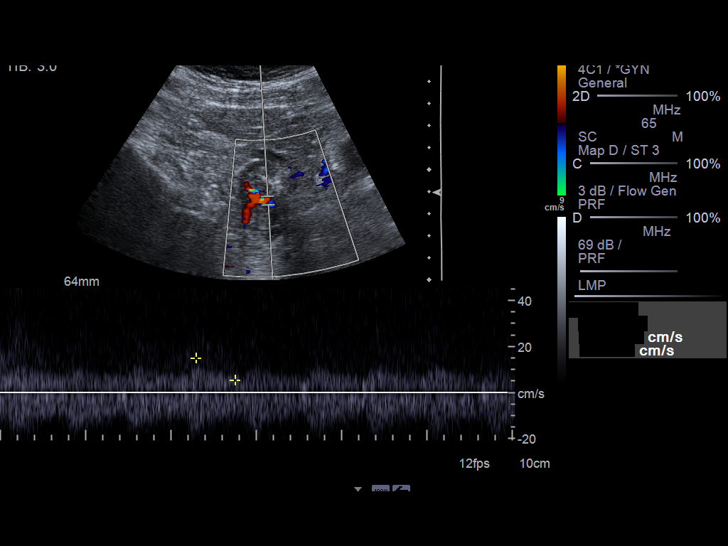
[im 40/80]
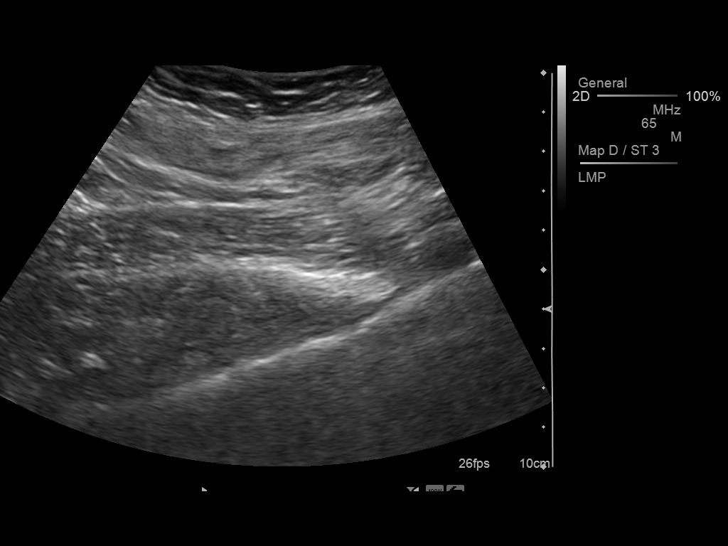
[im 47/80]
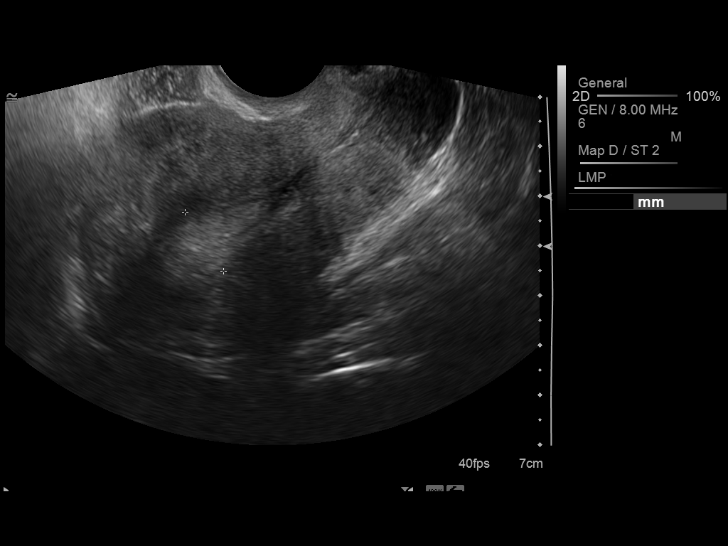
[im 53/80]
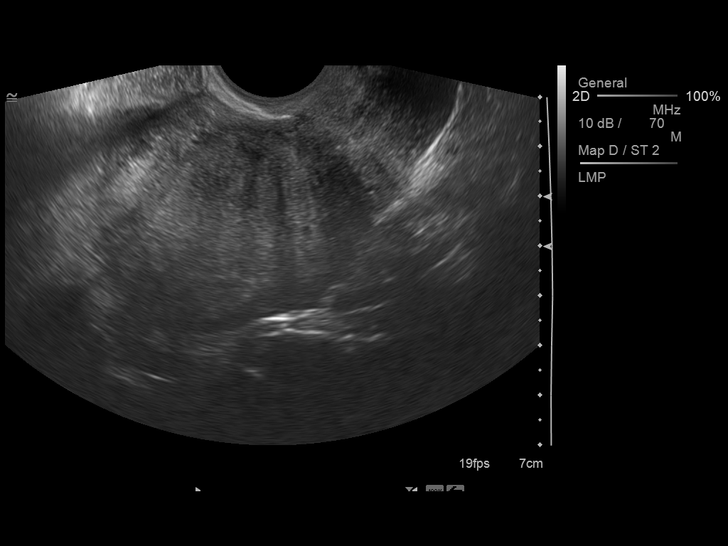
[im 60/80]
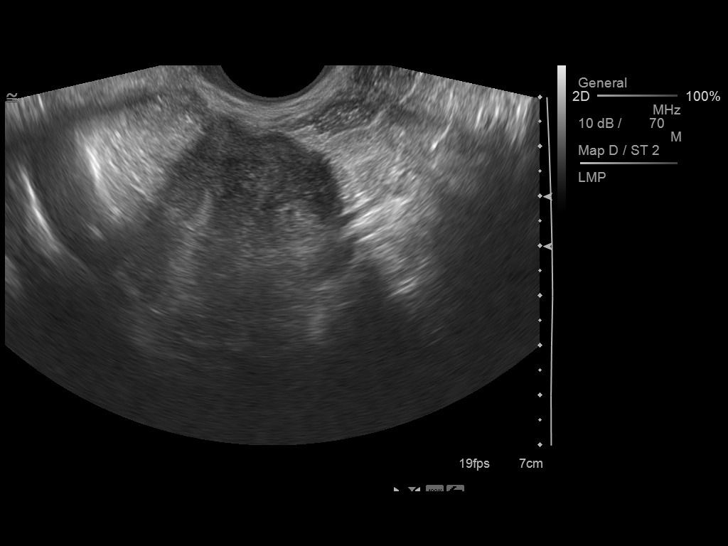
[im 66/80]
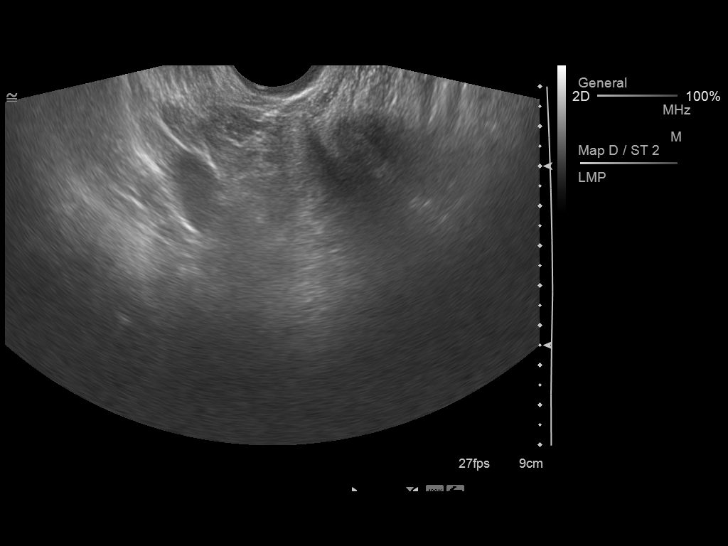
[im 73/80]
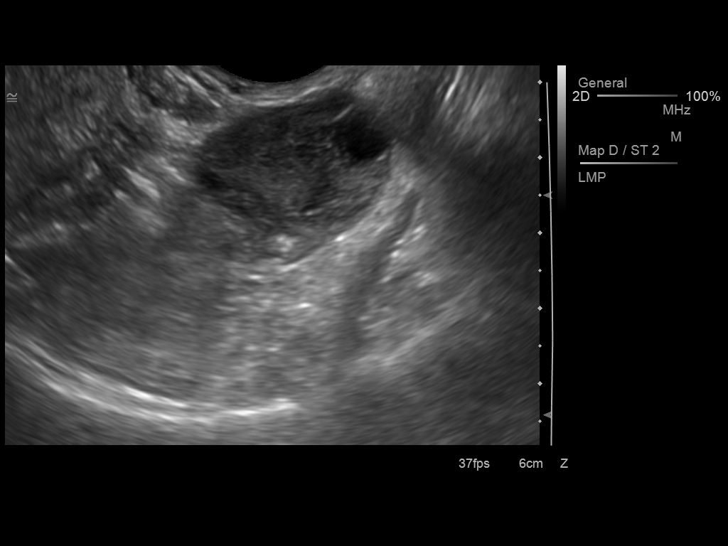
[im 80/80]
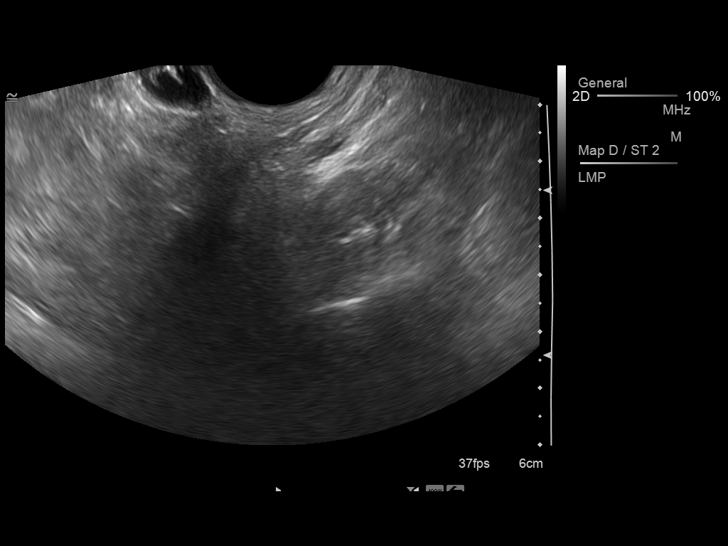

[13 of 25 positions shown; findings below may reference images not displayed]

FINDINGS: Uterus:  8.1 x 5.7 x 5.3 cm.  Mildly heterogeneous uterine
echotexture again identified.

Endometrium:  Normal for age, 14 mm.

Right ovary: 3.6 x 2.8 x 2.1 cm. Normal in morphology.

Left ovary:   3.1 x 2.6 x 2.3 cm.  A minimally hypoechoic lesion
has peripheral hypervascularity and measures 1.7 cm on image 73.

Pulsed Doppler evaluation demonstrates normal low-resistance
arterial and venous waveforms in both ovaries.

No significant free fluid.
IMPRESSION: 1.  No evidence of ovarian or adnexal torsion.  No evidence of
tubal ovarian abscess.
2.  1.7 cm left ovarian lesion demonstrates peripheral
hypervascularity and is most likely a corpus luteal cyst.
3.  Heterogeneous uterine echotexture again may represent
adenomyosis.

## 2014-02-16 ENCOUNTER — Encounter (HOSPITAL_COMMUNITY): Payer: Self-pay | Admitting: Emergency Medicine

## 2016-01-20 ENCOUNTER — Encounter: Payer: Self-pay | Admitting: Emergency Medicine

## 2016-01-20 ENCOUNTER — Emergency Department
Admission: EM | Admit: 2016-01-20 | Discharge: 2016-01-20 | Disposition: A | Discharge status: Home / Self Care | Payer: Medicare Other | Attending: Emergency Medicine | Admitting: Emergency Medicine

## 2016-01-20 ENCOUNTER — Emergency Department: Payer: Medicare Other

## 2016-01-20 DIAGNOSIS — Z79899 Other long term (current) drug therapy: Secondary | ICD-10-CM | POA: Diagnosis not present

## 2016-01-20 DIAGNOSIS — H9202 Otalgia, left ear: Secondary | ICD-10-CM | POA: Diagnosis present

## 2016-01-20 DIAGNOSIS — F17210 Nicotine dependence, cigarettes, uncomplicated: Secondary | ICD-10-CM | POA: Insufficient documentation

## 2016-01-20 DIAGNOSIS — Z791 Long term (current) use of non-steroidal anti-inflammatories (NSAID): Secondary | ICD-10-CM | POA: Diagnosis not present

## 2016-01-20 DIAGNOSIS — H6012 Cellulitis of left external ear: Secondary | ICD-10-CM | POA: Diagnosis not present

## 2016-01-20 HISTORY — DX: Reserved for concepts with insufficient information to code with codable children: IMO0002

## 2016-01-20 HISTORY — DX: Systemic lupus erythematosus, unspecified: M32.9

## 2016-01-20 HISTORY — DX: Systemic involvement of connective tissue, unspecified: M35.9

## 2016-01-20 LAB — BASIC METABOLIC PANEL
Anion gap: 7 (ref 5–15)
BUN: 15 mg/dL (ref 6–20)
CHLORIDE: 100 mmol/L — AB (ref 101–111)
CO2: 28 mmol/L (ref 22–32)
CREATININE: 0.77 mg/dL (ref 0.44–1.00)
Calcium: 8.7 mg/dL — ABNORMAL LOW (ref 8.9–10.3)
GFR calc non Af Amer: >60 mL/min (ref >60)
Glucose, Bld: 95 mg/dL (ref 65–99)
POTASSIUM: 3.9 mmol/L (ref 3.5–5.1)
Sodium: 135 mmol/L (ref 135–145)

## 2016-01-20 LAB — CBC WITH DIFFERENTIAL/PLATELET
BASOS ABS: 0 10*3/uL (ref 0–0.1)
Basophils Relative: 0 %
EOS PCT: 1 %
Eosinophils Absolute: 0.1 10*3/uL (ref 0–0.7)
HCT: 42.8 % (ref 35.0–47.0)
Hemoglobin: 14.8 g/dL (ref 12.0–16.0)
LYMPHS PCT: 27 %
Lymphs Abs: 2.5 10*3/uL (ref 1.0–3.6)
MCH: 32.3 pg (ref 26.0–34.0)
MCHC: 34.5 g/dL (ref 32.0–36.0)
MCV: 93.8 fL (ref 80.0–100.0)
Monocytes Absolute: 0.6 10*3/uL (ref 0.2–0.9)
Monocytes Relative: 6 %
NEUTROS ABS: 5.9 10*3/uL (ref 1.4–6.5)
Neutrophils Relative %: 66 %
Platelets: 230 10*3/uL (ref 150–440)
RBC: 4.56 MIL/uL (ref 3.80–5.20)
RDW: 13.6 % (ref 11.5–14.5)
WBC: 9.1 10*3/uL (ref 3.6–11.0)

## 2016-01-20 LAB — POCT PREGNANCY, URINE: PREG TEST UR: NEGATIVE

## 2016-01-20 MED ORDER — CIPROFLOXACIN HCL 500 MG PO TABS: 500.0000 mg | ORAL_TABLET | Freq: Two times a day (BID) | ORAL | 0 refills | Status: AC

## 2016-01-20 MED ORDER — HYDROMORPHONE HCL 1 MG/ML IJ SOLN
1.0000 mg | Freq: Once | INTRAMUSCULAR | Status: AC
  Administered 2016-01-20: 1 mg via INTRAVENOUS
  Filled 2016-01-20: qty 1

## 2016-01-20 MED ORDER — DEXTROSE 5 % IV SOLN
1.0000 g | Freq: Once | INTRAVENOUS | Status: AC
  Administered 2016-01-20: 1 g via INTRAVENOUS
  Filled 2016-01-20: qty 10

## 2016-01-20 MED ORDER — IOPAMIDOL (ISOVUE-300) INJECTION 61%
75.0000 mL | Freq: Once | INTRAVENOUS | Status: AC | PRN
  Administered 2016-01-20: 75 mL via INTRAVENOUS
  Filled 2016-01-20: qty 75

## 2016-01-20 MED ORDER — ONDANSETRON HCL 4 MG/2ML IJ SOLN
INTRAMUSCULAR | Status: AC
  Administered 2016-01-20: 11:00:00 4 mg via INTRAVENOUS
  Filled 2016-01-20: qty 2

## 2016-01-20 MED ORDER — ONDANSETRON HCL 4 MG/2ML IJ SOLN
4.0000 mg | Freq: Once | INTRAMUSCULAR | Status: AC
  Administered 2016-01-20: 4 mg via INTRAVENOUS

## 2016-01-20 NOTE — ED Triage Notes (Signed)
Pt presents to ED with left ear redness and pain. Pt reports having a piercing in that ear approximately three weeks ago and has had problems ever since.

## 2016-01-20 NOTE — Discharge Instructions (Signed)
Continue with your usual pain regimen. You may apply warm compresses and ice to the area as needed also.  Continue to take the keflex and doxycycline in addition to the cipro.

## 2016-01-20 NOTE — ED Notes (Signed)
Patient presents to the ED with swelling and redness to her left ear.  Patient states she got her ear cartilage pierced 3 weeks ago.  Patient states she was seen in the ED last night and now her throat and left side of her face hurt.  Patient is tearful during assessment.

## 2016-01-20 NOTE — ED Provider Notes (Signed)
[facility] Emergency Department Provider Note  ____________________________________________   First MD Initiated Contact with Patient 01/20/16 1007     (approximate)  I have reviewed the triage vital signs and the nursing notes.   HISTORY  Chief Complaint Otalgia and Piercing problem   HPI Teresa Escobar is a 35 y.o. female who presents with left ear pain and swelling x3 weeks. Patient had her left cartilage pierced and the ear has been painful and swollen since. She went to the ED in [facility] 2 nights ago and they needle aspirated the ear and numbed it, also gave the patient prescriptions for keflex and doxycycline. She has not been able to get any more drainage from the ear. Complains that pain is now also in her jaw, inner ear, and behind her ear. Patient denies fevers, chest pain, SOB, nausea or vomiting.    Past Medical History:  Diagnosis Date  . Abnormal Pap smear   . Arthritis   . Collagen vascular disease (HCC)    lupus  . Complication of anesthesia    hard time keeping her asleep for surgery  . Gastritis   . GERD (gastroesophageal reflux disease)   . History of kidney stones   . Interstitial cystitis   . Lupus   . PONV (postoperative nausea and vomiting)   . Tuberculosis     There are no active problems to display for this patient.   Past Surgical History:  Procedure Laterality Date  . ABDOMINAL HYSTERECTOMY    . BLADDER SURGERY    . CHOLECYSTECTOMY    . COLONOSCOPY WITH PROPOFOL N/A 03/19/2013   Procedure: COLONOSCOPY WITH PROPOFOL;  Surgeon: Willis Modena, MD;  Location: [facility];  Service: Endoscopy;  Laterality: N/A;  . TONSILLECTOMY    . TUBAL LIGATION      Prior to Admission medications   Medication Sig Start Date End Date Taking? Authorizing Provider  acetaminophen (TYLENOL) 325 MG tablet Take 650 mg by mouth every 6 (six) hours as needed for mild pain or moderate pain.     Historical Provider, MD    amphetamine-dextroamphetamine (ADDERALL XR) 20 MG 24 hr capsule Take 20 mg by mouth every morning.    Historical Provider, MD  amphetamine-dextroamphetamine (ADDERALL) 10 MG tablet Take 10 mg by mouth daily.    Historical Provider, MD  ciprofloxacin (CIPRO) 500 MG tablet Take 1 tablet (500 mg total) by mouth 2 (two) times daily. 01/20/16 01/30/16  Charmayne Sheer Beers, PA-C  fluconazole (DIFLUCAN) 150 MG tablet Take 1 tablet (150 mg total) by mouth daily. 04/18/13   Delbert Phenix, NP  meclizine (ANTIVERT) 25 MG tablet Take 25 mg by mouth 3 (three) times daily as needed for dizziness.    Historical Provider, MD  oxyCODONE (OXY IR/ROXICODONE) 5 MG immediate release tablet Take 5 mg by mouth every 6 (six) hours as needed for moderate pain or severe pain.     Historical Provider, MD  oxyCODONE (ROXICODONE) 5 MG immediate release tablet Take 1 tablet (5 mg total) by mouth every 4 (four) hours as needed for severe pain. 04/18/13   Trixie Dredge, PA-C  promethazine (PHENERGAN) 25 MG tablet Take 25 mg by mouth every 6 (six) hours as needed for nausea or vomiting.    Historical Provider, MD  propranolol (INDERAL) 10 MG tablet Take 10 mg by mouth 2 (two) times daily.    Historical Provider, MD    Allergies Hydrocodone-acetaminophen; Morphine and related; Nsaids; Other; and Tylenol [acetaminophen]  No  family history on file.  Social History Social History  Substance Use Topics  . Smoking status: Current Every Day Smoker    Packs/day: 0.25    Years: 15.00    Types: Cigarettes  . Smokeless tobacco: Not on file  . Alcohol use No    Review of Systems Constitutional: No fever/chills Eyes: No visual changes. ENT: Positive for left ear pain and swelling, sore throat. Cardiovascular: Denies chest pain. Respiratory: Denies shortness of breath. Gastrointestinal: No nausea, no vomiting.   Musculoskeletal: Negative for neck pain. Skin: Negative for rash. Neurological: Negative for  focal weakness or  numbness.   ____________________________________________   PHYSICAL EXAM:  VITAL SIGNS: ED Triage Vitals  Enc Vitals Group     BP 01/20/16 0937 (!) 139/104     Pulse Rate 01/20/16 0937 96     Resp 01/20/16 0937 18     Temp 01/20/16 0937 97.8 F (36.6 C)     Temp Source 01/20/16 0937 Oral     SpO2 01/20/16 0937 100 %     Weight 01/20/16 0938 207 lb (93.9 kg)     Height 01/20/16 0938 5\' 5"  (1.651 m)     Head Circumference --      Peak Flow --      Pain Score 01/20/16 0938 8     Pain Loc --      Pain Edu? --      Excl. in GC? --     Constitutional: Alert and oriented. Tearful but in no acute distress. Eyes: Conjunctivae are normal. PERRL.  Head: Atraumatic, normocephalic. Ears: Right canal and TM normal. Left pinna erythematous and swollen with small punctate opening at top with purulent drainage present. Left pinna very tender to manipulation. Left canal erythematous but without purulence. Left TM difficult to visualize. Mildly tender to palpation of left mastoid process.  Nose: No congestion/rhinnorhea. Mouth/Throat: Mucous membranes are moist.  Oropharynx non-erythematous and without exudate. Uvula is midline and not edematous. Mild tenderness on palpation of left jaw.  Neck: No stridor.  No cervical spine tenderness to palpation. Supple, full ROM without pain or difficulty.  Hematological/Lymphatic/Immunilogical: No cervical lymphadenopathy. Cardiovascular: Normal rate, regular rhythm. Grossly normal heart sounds.  Good peripheral circulation. Respiratory: Normal respiratory effort.  No retractions. Lungs CTAB. Musculoskeletal: Full ROM in all extremities without pain or difficulty.  Neurologic:  Normal speech and language. No gross focal neurologic deficits are appreciated.  Skin:  Skin is warm and dry. No rash noted. Psychiatric: Mood and affect are normal. Speech and behavior are normal.  ____________________________________________   LABS (all labs ordered are  listed, but only abnormal results are displayed)  Labs Reviewed  BASIC METABOLIC PANEL - Abnormal; Notable for the following:       Result Value   Chloride 100 (*)    Calcium 8.7 (*)    All other components within normal limits  CBC WITH DIFFERENTIAL/PLATELET  POC URINE PREG, ED  POCT PREGNANCY, URINE   ____________________________________________  EKG  None. ____________________________________________  RADIOLOGY  CT MAXILLOFACIAL WITH CONTRAST    TECHNIQUE:  Multidetector CT imaging of the maxillofacial structures was  performed with intravenous contrast. Multiplanar CT image  reconstructions were also generated. A small metallic BB was placed  on the right temple in order to reliably differentiate right from  left.    CONTRAST: 75mL ISOVUE-300 IOPAMIDOL (ISOVUE-300) INJECTION 61%    COMPARISON: None.    FINDINGS:  Osseous: No facial bone fracture. Mandibular condyles are located.  Normal appearance  of the visualized cervical spine.    Orbits: Normal appearance of bilateral globes and orbits.    Sinuses: Mucosal disease in the anterior ethmoid air cells  bilaterally. Mild mucosal disease in the bilateral maxillary  sinuses. Bilateral mastoid air cells are aerated and clear.    Soft tissues: Soft tissue swelling in the left mastoid area and  posterior to the left ear. There is not a discrete fluid collection  in this area. However, there are increased number of small lymph  nodes in this area. There also numerous nodular structures in the  left parotid gland which probably represents reactive  lymphadenopathy. The submandibular glands are symmetric. No gross  abnormality in the nasopharynx or oropharynx. There is mild swelling  in the left retropharyngeal fat. No gross abnormality in visualized  thyroid tissue.    Limited intracranial: Normal    IMPRESSION:  Soft tissue swelling around the left ear with evidence for reactive  lymphadenopathy in  the left upper neck. Small nodules in the left  parotid gland most likely represent enlarged lymph nodes.    No drainable fluid or abscess collection.    Mild edema in the left retropharyngeal tissue.      Electronically Signed  By: Richarda Overlie M.D.  On: 01/20/2016 12:39    ____________________________________________   PROCEDURES  Procedure(s) performed: None  Procedures  Critical Care performed: No  ____________________________________________   INITIAL IMPRESSION / ASSESSMENT AND PLAN / ED COURSE  Pertinent labs & imaging results that were available during my care of the patient were reviewed by me and considered in my medical decision making (see chart for details).  Patient presentation consistent with cellulitis of left pinna. Patient given IV Ceftriaxone and 2 mg IV dilaudid in ED, tolerated well with minimal reduction in pain. Patient is on chronic narcotic pain medicine and we will not be prescribing any other narcotics at this time. Consulted ENT, in according with recommendation added PO Cipro to patient's antibiotics. Patient will follow up with ENT in office tomorrow. No other emergency medicine complaints at this time.   Clinical Course     ____________________________________________   FINAL CLINICAL IMPRESSION(S) / ED DIAGNOSES  Final diagnoses:  Cellulitis of pinna, left      NEW MEDICATIONS STARTED DURING THIS VISIT:  New Prescriptions   CIPROFLOXACIN (CIPRO) 500 MG TABLET    Take 1 tablet (500 mg total) by mouth 2 (two) times daily.     Note:  This document was prepared using Dragon voice recognition software and may include unintentional dictation errors.   Evangeline Dakin, PA-C 01/20/16 1413    Jeanmarie Plant, MD 01/20/16 [phone]

## 2016-01-20 NOTE — Consult Note (Signed)
..   Teresa Escobar, Teresa Escobar [id number] May 13, 1980 Teresa PlantJames A McShane, MD  Reason for Consult: Left ear pain and inflammation  HPI: 35 y.o. Female s/p cartilagenous piercing 3 weeks ago presents with worsening left ear inflammation and swelling.  Seen in ED/Urgent care and reports was cultured and place on keflex.  Progressive worsening of pain.  Evaluated here in ED and CT performed.  No fluid collection present.  Slight amount of purulent discharge from piercing site.  Allergies:  Allergies  Allergen Reactions  . Hydrocodone-Acetaminophen     GI  . Morphine And Related     GI  . Nsaids     GI   . Other     IV tape burns skin  . Tylenol [Acetaminophen] Other (See Comments)    Liver problems    ROS: Review of systems normal other than 12 systems except per HPI.  PMH:  Past Medical History:  Diagnosis Date  . Abnormal Pap smear   . Arthritis   . Collagen vascular disease (HCC)    lupus  . Complication of anesthesia    hard time keeping her asleep for surgery  . Gastritis   . GERD (gastroesophageal reflux disease)   . History of kidney stones   . Interstitial cystitis   . Lupus   . PONV (postoperative nausea and vomiting)   . Tuberculosis     FH: No family history on file.  SH:  Social History   Social History  . Marital status: Married    Spouse name: N/A  . Number of children: N/A  . Years of education: N/A   Occupational History  . Not on file.   Social History Main Topics  . Smoking status: Current Every Day Smoker    Packs/day: 0.25    Years: 15.00    Types: Cigarettes  . Smokeless tobacco: Not on file  . Alcohol use No  . Drug use: No  . Sexual activity: Not on file   Other Topics Concern  . Not on file   Social History Narrative  . No narrative on file    PSH:  Past Surgical History:  Procedure Laterality Date  . ABDOMINAL HYSTERECTOMY    . BLADDER SURGERY    . CHOLECYSTECTOMY    . COLONOSCOPY WITH PROPOFOL N/A 03/19/2013   Procedure:  COLONOSCOPY WITH PROPOFOL;  Surgeon: Willis ModenaWilliam Outlaw, MD;  Location: [facility];  Service: Endoscopy;  Laterality: N/A;  . TONSILLECTOMY    . TUBAL LIGATION      Physical  Exam:  GEN- CN 2-12 grossly intact and symmetric. EARS-  Left external ear with inflammation and erythema.  No fluctuance noted.  Small amount of drainage from piercing site.  Minimally expressed fluid. OC/OP-  No masses or lesions NOSE-  Clear anteriorly NECK-  Shoddy lymphadenopathy RESP-  Unlabored CARD-  RRR  CT- no obvious fluid collection in left ear   A/P: Chondritis/cellulitis of left ear  Plan:  Patient reports culture already taken yesterday and recommend follow up with culture results.  Recommend Pseudomonas coverage with PO Cipro and consider MRSA coverage as well with doxycycline.  Recommend evaluation in my office tomorrow at 10:30 for recheck.   Teresa Escobar 01/20/2016 1:10 PM

## 2023-06-01 ENCOUNTER — Ambulatory Visit: Payer: 59

## 2023-06-01 LAB — GLUCOSE, POCT (MANUAL RESULT ENTRY): POC Glucose: 103 mg/dL — AB (ref 70–99)

## 2023-06-01 NOTE — Progress Notes (Unsigned)
New [place] patient. HIPAA form signed. Pt currently homeless, staying in [place].  VS taken and pt HTN, Pt states she does not take prescription meds. Asked to return in 2 weeks for re-check.
